# Patient Record
Sex: Female | Born: 1990 | Hispanic: No | Smoking: Never smoker
Health system: Southern US, Community
[De-identification: ages and names within clinical notes are randomized; demographics above are authoritative.]

## PROBLEM LIST (undated history)

## (undated) DIAGNOSIS — G935 Compression of brain: Secondary | ICD-10-CM

## (undated) DIAGNOSIS — F32A Depression, unspecified: Secondary | ICD-10-CM

## (undated) DIAGNOSIS — F329 Major depressive disorder, single episode, unspecified: Secondary | ICD-10-CM

## (undated) DIAGNOSIS — F419 Anxiety disorder, unspecified: Secondary | ICD-10-CM

---

## 2013-11-06 ENCOUNTER — Ambulatory Visit (INDEPENDENT_AMBULATORY_CARE_PROVIDER_SITE_OTHER): Payer: BC Managed Care – PPO | Admitting: Family Medicine

## 2013-11-06 VITALS — BP 102/74 | HR 72 | Temp 98.1°F | Resp 18 | Ht 65.5 in | Wt 180.0 lb

## 2013-11-06 DIAGNOSIS — J029 Acute pharyngitis, unspecified: Secondary | ICD-10-CM

## 2013-11-06 LAB — POCT RAPID STREP A (OFFICE): Rapid Strep A Screen: NEGATIVE

## 2013-11-06 MED ORDER — AMOXICILLIN 875 MG PO TABS: 875.0000 mg | ORAL_TABLET | Freq: Two times a day (BID) | ORAL | Status: DC

## 2013-11-06 NOTE — Patient Instructions (Signed)

## 2013-11-06 NOTE — Progress Notes (Signed)
Acute pharyngitis, unspecified pharyngitis type - Plan: amoxicillin (AMOXIL) 875 MG tablet, POCT rapid strep A, CANCELED: POCT rapid strep A  Signed, Elvina Sidle, MD      Patient ID: Olivia Kim MRN: [id number], DOB: 05/12/1990, 23 y.o. Date of Encounter: 11/06/2013, 1:49 PM  Primary Physician: No PCP Per Patient  Chief Complaint:  Chief Complaint  Patient presents with  . Sore Throat  . Cough    HPI: 23 y.o. year old female presents with day history of sore throat. Subjective fever and chills. No cough, congestion, rhinorrhea, sinus pressure, otalgia, or headache. Normal hearing. No GI complaints. Able to swallow saliva, but hurts to do so. Decreased appetite secondary to sore throat.   No past medical history on file.   Home Meds: Prior to Admission medications   Medication Sig Start Date End Date Taking? Authorizing Provider  amoxicillin (AMOXIL) 875 MG tablet Take 1 tablet (875 mg total) by mouth 2 (two) times daily. 11/06/13   Elvina Sidle, MD    Allergies: No Known Allergies  History   Social History  . Marital Status: Married    Spouse Name: N/A    Number of Children: N/A  . Years of Education: N/A   Occupational History  . Not on file.   Social History Main Topics  . Smoking status: Never Smoker   . Smokeless tobacco: Not on file  . Alcohol Use: Yes  . Drug Use: No  . Sexual Activity: Not on file   Other Topics Concern  . Not on file   Social History Narrative  . No narrative on file     Review of Systems: Constitutional: negative for chills, fever, night sweats or weight changes HEENT: see above Cardiovascular: negative for chest pain or palpitations Respiratory: negative for hemoptysis, wheezing, or shortness of breath Abdominal: negative for abdominal pain, nausea, vomiting or diarrhea Dermatological: negative for rash Neurologic: negative for headache   Physical Exam: Blood pressure 102/74, pulse 72, temperature 98.1 F (36.7  C), temperature source Oral, resp. rate 18, height 5' 5.5" (1.664 m), weight 180 lb (81.647 kg), last menstrual period 10/30/2013, SpO2 98.00%., Body mass index is 29.49 kg/(m^2). General: Well developed, well nourished, in no acute distress. Head: Normocephalic, atraumatic, eyes without discharge, sclera non-icteric, nares are patent. Bilateral auditory canals clear, TM's are without perforation, pearly grey with reflective cone of light bilaterally. No sinus TTP. Oral cavity moist, dentition normal. Posterior pharynx with post nasal drip and mild erythema. No peritonsillar abscess or tonsillar exudate. Neck: Supple. No thyromegaly. Full ROM. No lymphadenopathy. Lungs: Clear bilaterally to auscultation without wheezes, rales, or rhonchi. Breathing is unlabored. Heart: RRR with S1 S2. No murmurs, rubs, or gallops appreciated. Abdomen: Soft, non-tender, non-distended with normoactive bowel sounds. No hepatomegaly. No rebound/guarding. No obvious abdominal masses. Msk:  Strength and tone normal for age. Extremities: No clubbing or cyanosis. No edema. Neuro: Alert and oriented X 3. Moves all extremities spontaneously. CNII-XII grossly in tact. Psych:  Responds to questions appropriately with a normal affect.   Results for orders placed in visit on 11/06/13  POCT RAPID STREP A (OFFICE)      Result Value Ref Range   Rapid Strep A Screen Negative  Negative   (patient's grandmother insisted on doing the strep test)   ASSESSMENT AND PLAN:  23 y.o. year old female with acute pharyngitis -Acute pharyngitis, unspecified pharyngitis type - Plan: amoxicillin (AMOXIL) 875 MG tablet, POCT rapid strep A, CANCELED: POCT rapid strep A   -Tylenol/Motrin prn -  Rest/fluids -RTC precautions -RTC 3-5 days if no improvement  Signed, Elvina Sidle, MD 11/06/2013 1:49 PM

## 2013-11-08 ENCOUNTER — Telehealth: Payer: Self-pay

## 2013-11-08 NOTE — Telephone Encounter (Signed)
Will make note for pt today. Called and told her it would be ready for pick up.  Informed pt if symptoms are changing and not improving within next couple days that she should RTC for a re-check.

## 2013-11-08 NOTE — Telephone Encounter (Signed)
Patient seen recently by Dr. Milus Glazier. She received a note for school but now that she is vomiting, she needs another note for today as well. Can she get a new note ASAP? Cb# [phone]

## 2014-03-05 ENCOUNTER — Emergency Department (HOSPITAL_COMMUNITY)
Admission: EM | Admit: 2014-03-05 | Discharge: 2014-03-06 | Disposition: A | Discharge status: Home / Self Care | Payer: BLUE CROSS/BLUE SHIELD | Attending: Emergency Medicine | Admitting: Emergency Medicine

## 2014-03-05 ENCOUNTER — Encounter (HOSPITAL_COMMUNITY): Payer: Self-pay | Admitting: Emergency Medicine

## 2014-03-05 DIAGNOSIS — Z792 Long term (current) use of antibiotics: Secondary | ICD-10-CM | POA: Insufficient documentation

## 2014-03-05 DIAGNOSIS — Y9389 Activity, other specified: Secondary | ICD-10-CM | POA: Insufficient documentation

## 2014-03-05 DIAGNOSIS — Y9289 Other specified places as the place of occurrence of the external cause: Secondary | ICD-10-CM | POA: Insufficient documentation

## 2014-03-05 DIAGNOSIS — R4182 Altered mental status, unspecified: Secondary | ICD-10-CM | POA: Diagnosis present

## 2014-03-05 DIAGNOSIS — Z3202 Encounter for pregnancy test, result negative: Secondary | ICD-10-CM | POA: Diagnosis not present

## 2014-03-05 DIAGNOSIS — Y998 Other external cause status: Secondary | ICD-10-CM | POA: Insufficient documentation

## 2014-03-05 DIAGNOSIS — X58XXXA Exposure to other specified factors, initial encounter: Secondary | ICD-10-CM | POA: Diagnosis not present

## 2014-03-05 DIAGNOSIS — T483X2A Poisoning by antitussives, intentional self-harm, initial encounter: Secondary | ICD-10-CM | POA: Diagnosis not present

## 2014-03-05 LAB — CBC WITH DIFFERENTIAL/PLATELET
BASOS PCT: 0 % (ref 0–1)
Basophils Absolute: 0 10*3/uL (ref 0.0–0.1)
EOS PCT: 0 % (ref 0–5)
Eosinophils Absolute: 0 10*3/uL (ref 0.0–0.7)
HCT: 39.5 % (ref 36.0–46.0)
Hemoglobin: 13.6 g/dL (ref 12.0–15.0)
Lymphocytes Relative: 12 % (ref 12–46)
Lymphs Abs: 1.4 10*3/uL (ref 0.7–4.0)
MCH: 30.6 pg (ref 26.0–34.0)
MCHC: 34.4 g/dL (ref 30.0–36.0)
MCV: 89 fL (ref 78.0–100.0)
MONO ABS: 0.8 10*3/uL (ref 0.1–1.0)
Monocytes Relative: 7 % (ref 3–12)
NEUTROS PCT: 81 % — AB (ref 43–77)
Neutro Abs: 9.9 10*3/uL — ABNORMAL HIGH (ref 1.7–7.7)
PLATELETS: 287 10*3/uL (ref 150–400)
RBC: 4.44 MIL/uL (ref 3.87–5.11)
RDW: 12.1 % (ref 11.5–15.5)
WBC: 12.2 10*3/uL — ABNORMAL HIGH (ref 4.0–10.5)

## 2014-03-05 LAB — COMPREHENSIVE METABOLIC PANEL
ALT: 22 U/L (ref 0–35)
AST: 27 U/L (ref 0–37)
Albumin: 4.1 g/dL (ref 3.5–5.2)
Alkaline Phosphatase: 58 U/L (ref 39–117)
Anion gap: 8 (ref 5–15)
BUN: 6 mg/dL (ref 6–23)
CALCIUM: 9.3 mg/dL (ref 8.4–10.5)
CO2: 25 mmol/L (ref 19–32)
Chloride: 105 mEq/L (ref 96–112)
Creatinine, Ser: 1.03 mg/dL (ref 0.50–1.10)
GFR calc Af Amer: 89 mL/min — ABNORMAL LOW (ref >90)
GFR calc non Af Amer: 77 mL/min — ABNORMAL LOW (ref >90)
GLUCOSE: 111 mg/dL — AB (ref 70–99)
POTASSIUM: 3.3 mmol/L — AB (ref 3.5–5.1)
SODIUM: 138 mmol/L (ref 135–145)
Total Bilirubin: 0.6 mg/dL (ref 0.3–1.2)
Total Protein: 7.3 g/dL (ref 6.0–8.3)

## 2014-03-05 LAB — RAPID URINE DRUG SCREEN, HOSP PERFORMED
Amphetamines: NOT DETECTED
BARBITURATES: NOT DETECTED
BENZODIAZEPINES: NOT DETECTED
COCAINE: NOT DETECTED
OPIATES: NOT DETECTED
TETRAHYDROCANNABINOL: NOT DETECTED

## 2014-03-05 LAB — URINALYSIS, ROUTINE W REFLEX MICROSCOPIC
Bilirubin Urine: NEGATIVE
Glucose, UA: NEGATIVE mg/dL
Ketones, ur: NEGATIVE mg/dL
Nitrite: NEGATIVE
Protein, ur: NEGATIVE mg/dL
Specific Gravity, Urine: 1.006 (ref 1.005–1.030)
UROBILINOGEN UA: 0.2 mg/dL (ref 0.0–1.0)
pH: 6 (ref 5.0–8.0)

## 2014-03-05 LAB — ACETAMINOPHEN LEVEL: Acetaminophen (Tylenol), Serum: <10 ug/mL — ABNORMAL LOW (ref 10–30)

## 2014-03-05 LAB — URINE MICROSCOPIC-ADD ON

## 2014-03-05 LAB — SALICYLATE LEVEL: Salicylate Lvl: <4 mg/dL (ref 2.8–20.0)

## 2014-03-05 LAB — ETHANOL

## 2014-03-05 LAB — PREGNANCY, URINE: Preg Test, Ur: NEGATIVE

## 2014-03-05 MED ORDER — LORAZEPAM 2 MG/ML IJ SOLN
1.0000 mg | Freq: Once | INTRAMUSCULAR | Status: AC
  Administered 2014-03-05: 1 mg via INTRAVENOUS
  Filled 2014-03-05: qty 1

## 2014-03-05 MED ORDER — SODIUM CHLORIDE 0.9 % IV SOLN
INTRAVENOUS | Status: DC
  Administered 2014-03-05: 22:00:00 via INTRAVENOUS

## 2014-03-05 NOTE — ED Notes (Addendum)
Pt. presents with agitation, panic attack , altered mental status , and confusion , pt. stated she ingested a whole bottle of cough syrup this evening , respirations unlabored / denies suicidal ideation .

## 2014-03-05 NOTE — ED Provider Notes (Signed)
CSN: [id number]     Arrival date & time 03/05/14  2138 History   First MD Initiated Contact with Patient 03/05/14 2150     Chief Complaint  Patient presents with  . Altered Mental Status  . Agitation     (Consider location/radiation/quality/duration/timing/severity/associated sxs/prior Treatment) Patient is a 24 y.o. female presenting with altered mental status. The history is provided by the patient and the spouse.  Altered Mental Status   Olivia Kim is a 24 y.o. female who is here for evaluation of altered mental status, after drinking "a whole bottle of dextromethorphan."  She states that she was "trying to get skinny for [place]".  She admits that she may been tried to kill herself.  She is unable to give additional history.  Level V Caveat- Altered Mental Status   History reviewed. No pertinent past medical history. History reviewed. No pertinent past surgical history. No family history on file. History  Substance Use Topics  . Smoking status: Never Smoker   . Smokeless tobacco: Not on file  . Alcohol Use: Yes   OB History    No data available     Review of Systems  Unable to perform ROS     Allergies  Review of patient's allergies indicates no known allergies.  Home Medications   Prior to Admission medications   Medication Sig Start Date End Date Taking? Authorizing Provider  cyclobenzaprine (FLEXERIL) 5 MG tablet Take 5 mg by mouth 3 (three) times daily as needed for muscle spasms.   Yes Historical Provider, MD  medroxyPROGESTERone (DEPO-PROVERA) 150 MG/ML injection Inject 150 mg into the muscle every 3 (three) months.   Yes Historical Provider, MD  amoxicillin (AMOXIL) 875 MG tablet Take 1 tablet (875 mg total) by mouth 2 (two) times daily. 11/06/13   Elvina Sidle, MD  nitrofurantoin, macrocrystal-monohydrate, (MACROBID) 100 MG capsule Take 100 mg by mouth 2 (two) times daily. 02/22/14   Historical Provider, MD   BP 138/81 mmHg  Pulse 129  Temp(Src)  98.8 F (37.1 C) (Oral)  Resp 20  SpO2 99% Physical Exam  Constitutional: She is oriented to person, place, and time. She appears well-developed and well-nourished. She appears distressed (she is agitated and tearful.).  HENT:  Head: Normocephalic and atraumatic.  Right Ear: External ear normal.  Left Ear: External ear normal.  Eyes: Conjunctivae and EOM are normal. Pupils are equal, round, and reactive to light.  Neck: Normal range of motion and phonation normal. Neck supple.  Cardiovascular: Normal rate, regular rhythm and normal heart sounds.   Pulmonary/Chest: Effort normal and breath sounds normal. She exhibits no bony tenderness.  Abdominal: Soft. There is no tenderness.  Musculoskeletal: Normal range of motion.  Neurological: She is alert and oriented to person, place, and time. No cranial nerve deficit or sensory deficit. She exhibits normal muscle tone. Coordination normal.  She intermittently answers questions, without dysarthria or aphasia.  She is a poor historian.  No gross ataxia.  No nystagmus. She does not follow commands for complete neurologic testing.  Skin: Skin is warm, dry and intact.  Psychiatric:  Labile affect, frequently stops talking, and bursts into tears.  She has brief periods of lucidity, and periods of paranoid  thoughts and behavior.  Nursing note and vitals reviewed.   ED Course  Procedures (including critical care time)  The patient told the pharmacy technician, that she ingested a product called Delsym.  It is not clear if it had any ingredients in addition to, dextromethorphan.  Medications  0.9 %  sodium chloride infusion ( Intravenous New Bag/Given 03/05/14 2209)  LORazepam (ATIVAN) injection 1 mg (1 mg Intravenous Given 03/05/14 2210)    Patient Vitals for the past 24 hrs:  BP Temp Temp src Pulse Resp SpO2  03/05/14 2144 138/81 mmHg 98.8 F (37.1 C) Oral (!) 129 20 99 %    11:54 PM Reevaluation with update and discussion. After initial  assessment and treatment, an updated evaluation reveals she is, now, somewhat sleepy, and agrees to stay for further evaluation.  She complains of not being able to urinate.  She was catheterized, about 15 minutes ago, but cannot recall that.  Her significant other, Olivia Kim, is here with her. Olivia Kim L    Labs Review Labs Reviewed  URINALYSIS, ROUTINE W REFLEX MICROSCOPIC - Abnormal; Notable for the following:    Hgb urine dipstick TRACE (*)    Leukocytes, UA SMALL (*)    All other components within normal limits  COMPREHENSIVE METABOLIC PANEL - Abnormal; Notable for the following:    Potassium 3.3 (*)    Glucose, Bld 111 (*)    GFR calc non Af Amer 77 (*)    GFR calc Af Amer 89 (*)    All other components within normal limits  CBC WITH DIFFERENTIAL - Abnormal; Notable for the following:    WBC 12.2 (*)    Neutrophils Relative % 81 (*)    Neutro Abs 9.9 (*)    All other components within normal limits  ACETAMINOPHEN LEVEL - Abnormal; Notable for the following:    Acetaminophen (Tylenol), Serum <10.0 (*)    All other components within normal limits  URINE MICROSCOPIC-ADD ON - Abnormal; Notable for the following:    Squamous Epithelial / LPF FEW (*)    Bacteria, UA FEW (*)    All other components within normal limits  URINE CULTURE  PREGNANCY, URINE  URINE RAPID DRUG SCREEN (HOSP PERFORMED)  SALICYLATE LEVEL  ETHANOL    Imaging Review No results found.   EKG Interpretation   Date/Time:  Tuesday March 05 2014 22:27:05 EST Ventricular Rate:  100 PR Interval:  154 QRS Duration: 76 QT Interval:  336 QTC Calculation: 433 R Axis:   86 Text Interpretation:  Sinus tachycardia Consider right atrial enlargement  Borderline repolarization abnormality No old tracing to compare Confirmed  by [facility]  MD, Lillar Bianca [phone]([id number]) on 03/05/2014 10:38:34 PM      MDM   Final diagnoses:  Dextromethorphan overdose, intentional self-harm, initial encounter     Apparent,  dextromethorphan overdose.  Moderately severe toxicity.  I doubt severe anticholinergic toxicity.  Nursing Notes Reviewed/ Care Coordinated, and agree without changes. Applicable Imaging Reviewed.  Interpretation of Laboratory Data incorporated into ED treatment  Plan: Assessment by TTS, and disposition, in assistance with Emergency provider services  Flint MelterElliott L Paxon Propes, MD 03/08/14 [phone]

## 2014-03-05 NOTE — ED Notes (Signed)
Per [facility]eborah at [facility]:  If product ingested contains cloracedin watch out for: pinpoint pupils, urinary retention, seizures, paranoia.  Recommends EKG, Electrolytes, UA, CBC, and a Tylenol level.  Also recommends treatment with IV fluids and benzos for agitation (does not recommend Haldol).  Sts this medication is called "Triple C" in abusers of the product.

## 2014-03-06 MED ORDER — ALUM & MAG HYDROXIDE-SIMETH 200-200-20 MG/5ML PO SUSP: 30.0000 mL | ORAL | Status: DC | PRN

## 2014-03-06 MED ORDER — ONDANSETRON HCL 4 MG PO TABS
4.0000 mg | ORAL_TABLET | Freq: Three times a day (TID) | ORAL | Status: DC | PRN
Start: 2014-03-06 — End: 2014-03-06

## 2014-03-06 MED ORDER — ACETAMINOPHEN 325 MG PO TABS: 650.0000 mg | ORAL_TABLET | ORAL | Status: DC | PRN

## 2014-03-06 MED ORDER — IBUPROFEN 200 MG PO TABS: 600.0000 mg | ORAL_TABLET | Freq: Three times a day (TID) | ORAL | Status: DC | PRN

## 2014-03-06 MED ORDER — ZOLPIDEM TARTRATE 5 MG PO TABS: 5.0000 mg | ORAL_TABLET | Freq: Every evening | ORAL | Status: DC | PRN

## 2014-03-06 MED ORDER — NICOTINE 21 MG/24HR TD PT24: 21.0000 mg | MEDICATED_PATCH | Freq: Every day | TRANSDERMAL | Status: DC | PRN

## 2014-03-06 NOTE — Discharge Instructions (Signed)
Depression °Depression refers to feeling sad, low, down in the dumps, blue, gloomy, or empty. In general, there are two kinds of depression: °· Normal sadness or normal grief. This kind of depression is one that we all feel from time to time after upsetting life experiences, such as the loss of a job or the ending of a relationship. This kind of depression is considered normal, is short lived, and resolves within a few days to 2 weeks. Depression experienced after the loss of a loved one (bereavement) often lasts longer than 2 weeks but normally gets better with time. °· Clinical depression. This kind of depression lasts longer than normal sadness or normal grief or interferes with your ability to function at home, at work, and in school. It also interferes with your personal relationships. It affects almost every aspect of your life. Clinical depression is an illness. °Symptoms of depression can also be caused by conditions other than those mentioned above, such as: °· Physical illness. Some physical illnesses, including underactive thyroid gland (hypothyroidism), severe anemia, specific types of cancer, diabetes, uncontrolled seizures, heart and lung problems, strokes, and chronic pain are commonly associated with symptoms of depression. °· Side effects of some prescription medicine. In some people, certain types of medicine can cause symptoms of depression. °· Substance abuse. Abuse of alcohol and illicit drugs can cause symptoms of depression. °SYMPTOMS °Symptoms of normal sadness and normal grief include the following: °· Feeling sad or crying for short periods of time. °· Not caring about anything (apathy). °· Difficulty sleeping or sleeping too much. °· No longer able to enjoy the things you used to enjoy. °· Desire to be by oneself all the time (social isolation). °· Lack of energy or motivation. °· Difficulty concentrating or remembering. °· Change in appetite or weight. °· Restlessness or  agitation. °Symptoms of clinical depression include the same symptoms of normal sadness or normal grief and also the following symptoms: °· Feeling sad or crying all the time. °· Feelings of guilt or worthlessness. °· Feelings of hopelessness or helplessness. °· Thoughts of suicide or the desire to harm yourself (suicidal ideation). °· Loss of touch with reality (psychotic symptoms). Seeing or hearing things that are not real (hallucinations) or having false beliefs about your life or the people around you (delusions and paranoia). °DIAGNOSIS  °The diagnosis of clinical depression is usually based on how bad the symptoms are and how long they have lasted. Your health care provider will also ask you questions about your medical history and substance use to find out if physical illness, use of prescription medicine, or substance abuse is causing your depression. Your health care provider may also order blood tests. °TREATMENT  °Often, normal sadness and normal grief do not require treatment. However, sometimes antidepressant medicine is given for bereavement to ease the depressive symptoms until they resolve. °The treatment for clinical depression depends on how bad the symptoms are but often includes antidepressant medicine, counseling with a mental health professional, or both. Your health care provider will help to determine what treatment is best for you. °Depression caused by physical illness usually goes away with appropriate medical treatment of the illness. If prescription medicine is causing depression, talk with your health care provider about stopping the medicine, decreasing the dose, or changing to another medicine. °Depression caused by the abuse of alcohol or illicit drugs goes away when you stop using these substances. Some adults need professional help in order to stop drinking or using drugs. °SEEK IMMEDIATE MEDICAL   CARE IF:  You have thoughts about hurting yourself or others.  You lose touch  with reality (have psychotic symptoms).  You are taking medicine for depression and have a serious side effect. FOR MORE INFORMATION  National Alliance on Mental Illness: www.nami.[employer] of Mental Health: http://www.maynard.net/ Document Released: 01/30/2000 Document Revised: 06/18/2013 Document Reviewed: 05/03/2011 [facility] Patient Information 2015 [place], [place]. This information is not intended to replace advice given to you by your health care provider. Make sure you discuss any questions you have with your health care provider.   Poisoning Information Poisoning is illness caused by eating, drinking, touching, or inhaling a harmful substance. The damaging effects on the person's health will vary depending on the type of poison, the amount of exposure, and the duration of exposure before treatment. These effects may range from mild to very severe or even fatal.  Most poisonings take place in the home and involve common household products. They can also occur in the workplace, especially in industrial or manufacturing facilities. Poisoning is more common in children than adults. However, poisoning often causes more serious illness in adults. Poisonings are often accidental, but there are also many cases in which a person intentionally ingests poison. WHAT THINGS MAY BE POISONOUS?  A poison can be any substance that causes illness or harm to the body. Poisoning is often caused by products that are commonly found in homes. Many substances can become poisonous if used in ways or amounts that are not appropriate. Some common products that can cause poisoning are:   Medicines, including prescription medicines, over-the-counter pain medicines, vitamins, iron pills, and herbal supplements.  Cleaning or laundry products.  Paint and paint thinner.  Weed or insect killers.  Perfume, hair spray, or nail products.  Alcohol.  Plants, such as philodendron, poinsettia, oleander, castor  bean, cactus, and tomato plants.  Batteries.  Furniture polish.  Drain cleaners.  Antifreeze or other automotive products.  Gasoline, lighter fluid, or lamp oil.  Carbon monoxide gas from furnaces or automobiles.  Toxic fumes from the burning of plastics or certain other materials. WHAT ARE SOME FIRST-AID MEASURES FOR POISONING? The local poison control center must be contacted whenever a person may have been exposed to poison. The poison control specialist will often give a set of directions to follow over the phone. These directions may include the following:  Remove any substance that is still in the mouth if the poison was not food or medicine. Drink a small amount of water.  Keep the medicine container if too much medicine or the wrong medicine was swallowed. Use it to identify the medicine to the poison control specialist.  Get away from the area where exposure occurred as soon as possible if the poison was from fumes or chemicals.  Get fresh air as soon as possible if a poison was inhaled.  Remove any affected clothing and rinse the skin with water if a poison got on the skin.  Rinse the eyes with water if a poison or chemical got in the eyes.  Begin cardiopulmonary resuscitation (CPR) if breathing stops. HOW CAN YOU PREVENT POISONING? Take these steps to help prevent poisoning:  Keep medicines and chemical products in their original containers. Many of these come in child-safe packaging. Store them in areas out of reach of children.  Educate othersabout the dangers of possible poisons.  Read labels before using medicine or household products. Leave the original labels on the containers.  Always turn on a light when taking  medicine. Check the dosage every time.   Close the containers tightly after using medicine or chemical products.  Get rid of unneeded and outdated medicines by following the specific disposal instructions on the medicine label or the patient  information that came with the medicine. Do not put medicine in the trash or flush it down the toilet. Use the community's drug take-back program to dispose of medicine. If these options are not available, take the medicine out of the original container and mix it with an undesirable substance, such as coffee grounds or kitty litter. Seal the mixture in a sealable bag, can, or other container and throw it away.  Keep all dangerous household products (such as lighter fluid, paint thinner and remover, gasoline, and antifreeze) in locked cabinets.  Do not mix different household chemicals with each other.  Use protective equipment (gloves, goggles, masks, aprons) as needed when using chemicals or cleaners.  Install a carbon monoxide detector in your home. WHEN SHOULD YOU SEEK HELP?  Contact the poison control center wheneveryou suspect that a person has been exposed to poison. Call [phone] (in the [place] to reach a poison center for your area. If you are outside the [place] ask your health care provider what the phone number is for your local poison control center. Keep the phone number posted near your phone. Make sure everyone in your household knows where to find the number. The local emergency services (911 in [place] must be contacted if a person has been exposed to poison and:   Has trouble breathing or stops breathing.  Develops chest pain.  Has trouble staying awake or becomes unconscious.  Has a seizure.  Has severe vomiting or bleeding.  Has a worsening headache.  Has a decreased level of alertness.  Develops a widespread rash that may or may not be painful.  Has changes in vision.  Has difficulty swallowing.  Develops severe abdominal pain. FOR MORE INFORMATION  American Association of Poison Control Centers: www.aapcc.org Document Released: 01/19/2012 Document Revised: 06/18/2013 Document Reviewed: 01/19/2012 [facility] Patient Information 2015 [place]. This  information is not intended to replace advice given to you by your health care provider. Make sure you discuss any questions you have with your health care provider.

## 2014-03-06 NOTE — ED Notes (Signed)
Poison control called to check on pt status, confirmed that pt was "released" from poison control monitoring.

## 2014-03-06 NOTE — ED Notes (Signed)
[facility] will be fax[facility] a Engineer, manufacturing systemssafety contract (no harm), eating disorder consult (initial)-put sticker and put in medical record.

## 2014-03-06 NOTE — ED Notes (Signed)
Pt signed no harm contract, voicing that she does not want to do harm to herself or others. Pt also given resources for eating disorder consult and signed that she agrees to make an appointment this week.  Copy of no harm contract put in medical records.

## 2014-03-06 NOTE — ED Notes (Signed)
Bladder scan performed, pt had 72 mL in bladder.

## 2014-03-06 NOTE — ED Notes (Signed)
Pt made aware to return if symptoms worsen or if any life threatening symptoms occur.   
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
## 2014-03-07 ENCOUNTER — Encounter (HOSPITAL_COMMUNITY): Payer: Self-pay | Admitting: Neurology

## 2014-03-07 ENCOUNTER — Encounter (HOSPITAL_COMMUNITY): Payer: Self-pay | Admitting: *Deleted

## 2014-03-07 ENCOUNTER — Emergency Department (HOSPITAL_COMMUNITY)
Admission: EM | Admit: 2014-03-07 | Discharge: 2014-03-07 | Disposition: A | Discharge status: Other Acute Inpatient Hospital | Payer: BLUE CROSS/BLUE SHIELD | Attending: Emergency Medicine | Admitting: Emergency Medicine

## 2014-03-07 ENCOUNTER — Inpatient Hospital Stay (HOSPITAL_COMMUNITY)
Admission: EM | Admit: 2014-03-07 | Discharge: 2014-03-11 | DRG: 897 | Disposition: A | Discharge status: Home / Self Care | Payer: BLUE CROSS/BLUE SHIELD | Source: Intra-hospital | Attending: Emergency Medicine | Admitting: Emergency Medicine

## 2014-03-07 DIAGNOSIS — R410 Disorientation, unspecified: Secondary | ICD-10-CM

## 2014-03-07 DIAGNOSIS — F19980 Other psychoactive substance use, unspecified with psychoactive substance-induced anxiety disorder: Secondary | ICD-10-CM | POA: Diagnosis present

## 2014-03-07 DIAGNOSIS — Z818 Family history of other mental and behavioral disorders: Secondary | ICD-10-CM | POA: Diagnosis not present

## 2014-03-07 DIAGNOSIS — Z79899 Other long term (current) drug therapy: Secondary | ICD-10-CM | POA: Insufficient documentation

## 2014-03-07 DIAGNOSIS — F29 Unspecified psychosis not due to a substance or known physiological condition: Secondary | ICD-10-CM

## 2014-03-07 DIAGNOSIS — R45851 Suicidal ideations: Secondary | ICD-10-CM | POA: Diagnosis present

## 2014-03-07 DIAGNOSIS — Z792 Long term (current) use of antibiotics: Secondary | ICD-10-CM | POA: Diagnosis not present

## 2014-03-07 DIAGNOSIS — F19939 Other psychoactive substance use, unspecified with withdrawal, unspecified: Secondary | ICD-10-CM | POA: Diagnosis present

## 2014-03-07 DIAGNOSIS — F3110 Bipolar disorder, current episode manic without psychotic features, unspecified: Secondary | ICD-10-CM | POA: Clinically undetermined

## 2014-03-07 DIAGNOSIS — F1994 Other psychoactive substance use, unspecified with psychoactive substance-induced mood disorder: Secondary | ICD-10-CM | POA: Diagnosis present

## 2014-03-07 DIAGNOSIS — F1990 Other psychoactive substance use, unspecified, uncomplicated: Secondary | ICD-10-CM | POA: Diagnosis present

## 2014-03-07 DIAGNOSIS — R4182 Altered mental status, unspecified: Secondary | ICD-10-CM

## 2014-03-07 DIAGNOSIS — F19280 Other psychoactive substance dependence with psychoactive substance-induced anxiety disorder: Secondary | ICD-10-CM

## 2014-03-07 DIAGNOSIS — F19239 Other psychoactive substance dependence with withdrawal, unspecified: Secondary | ICD-10-CM | POA: Diagnosis present

## 2014-03-07 HISTORY — DX: Major depressive disorder, single episode, unspecified: F32.9

## 2014-03-07 HISTORY — DX: Depression, unspecified: F32.A

## 2014-03-07 HISTORY — DX: Anxiety disorder, unspecified: F41.9

## 2014-03-07 LAB — SALICYLATE LEVEL: Salicylate Lvl: <4 mg/dL (ref 2.8–20.0)

## 2014-03-07 LAB — CBC
HEMATOCRIT: 40 % (ref 36.0–46.0)
HEMOGLOBIN: 13.6 g/dL (ref 12.0–15.0)
MCH: 30.7 pg (ref 26.0–34.0)
MCHC: 34 g/dL (ref 30.0–36.0)
MCV: 90.3 fL (ref 78.0–100.0)
Platelets: 271 10*3/uL (ref 150–400)
RBC: 4.43 MIL/uL (ref 3.87–5.11)
RDW: 12.4 % (ref 11.5–15.5)
WBC: 8.7 10*3/uL (ref 4.0–10.5)

## 2014-03-07 LAB — COMPREHENSIVE METABOLIC PANEL
ALT: 23 U/L (ref 0–35)
AST: 26 U/L (ref 0–37)
Albumin: 4.1 g/dL (ref 3.5–5.2)
Alkaline Phosphatase: 55 U/L (ref 39–117)
Anion gap: 13 (ref 5–15)
BILIRUBIN TOTAL: 0.7 mg/dL (ref 0.3–1.2)
BUN: 10 mg/dL (ref 6–23)
CHLORIDE: 106 meq/L (ref 96–112)
CO2: 19 mmol/L (ref 19–32)
CREATININE: 0.89 mg/dL (ref 0.50–1.10)
Calcium: 8.9 mg/dL (ref 8.4–10.5)
GFR calc Af Amer: >90 mL/min (ref >90)
Glucose, Bld: 105 mg/dL — ABNORMAL HIGH (ref 70–99)
Potassium: 3.6 mmol/L (ref 3.5–5.1)
Sodium: 138 mmol/L (ref 135–145)
TOTAL PROTEIN: 7.2 g/dL (ref 6.0–8.3)

## 2014-03-07 LAB — URINE CULTURE
CULTURE: NO GROWTH
Colony Count: NO GROWTH
SPECIAL REQUESTS: NORMAL

## 2014-03-07 LAB — RAPID URINE DRUG SCREEN, HOSP PERFORMED
Amphetamines: NOT DETECTED
BARBITURATES: NOT DETECTED
Benzodiazepines: POSITIVE — AB
COCAINE: NOT DETECTED
OPIATES: NOT DETECTED
TETRAHYDROCANNABINOL: NOT DETECTED

## 2014-03-07 LAB — PROTIME-INR
INR: 1.19 (ref 0.00–1.49)
PROTHROMBIN TIME: 15.2 s (ref 11.6–15.2)

## 2014-03-07 LAB — ACETAMINOPHEN LEVEL: Acetaminophen (Tylenol), Serum: <10 ug/mL — ABNORMAL LOW (ref 10–30)

## 2014-03-07 LAB — ETHANOL: Alcohol, Ethyl (B): <5 mg/dL (ref 0–9)

## 2014-03-07 MED ORDER — ALUM & MAG HYDROXIDE-SIMETH 200-200-20 MG/5ML PO SUSP: 30.0000 mL | ORAL | Status: DC | PRN

## 2014-03-07 MED ORDER — MAGNESIUM HYDROXIDE 400 MG/5ML PO SUSP
30.0000 mL | Freq: Every day | ORAL | Status: DC | PRN
  Administered 2014-03-11: 30 mL via ORAL
  Filled 2014-03-07: qty 30

## 2014-03-07 MED ORDER — ACETAMINOPHEN 325 MG PO TABS
650.0000 mg | ORAL_TABLET | Freq: Four times a day (QID) | ORAL | Status: DC | PRN
  Administered 2014-03-08 – 2014-03-11 (×5): 650 mg via ORAL
  Filled 2014-03-07 (×4): qty 2

## 2014-03-07 MED ORDER — LORAZEPAM 1 MG PO TABS
1.0000 mg | ORAL_TABLET | ORAL | Status: AC | PRN
  Administered 2014-03-08: 1 mg via ORAL
  Filled 2014-03-07: qty 1

## 2014-03-07 MED ORDER — RISPERIDONE 2 MG PO TBDP
2.0000 mg | ORAL_TABLET | Freq: Three times a day (TID) | ORAL | Status: DC | PRN
  Administered 2014-03-07 – 2014-03-08 (×2): 2 mg via ORAL
  Filled 2014-03-07 (×3): qty 1

## 2014-03-07 MED ORDER — LORAZEPAM 1 MG PO TABS
1.0000 mg | ORAL_TABLET | Freq: Once | ORAL | Status: DC
  Filled 2014-03-07: qty 1

## 2014-03-07 MED ORDER — ONDANSETRON HCL 4 MG PO TABS: 4.0000 mg | ORAL_TABLET | Freq: Three times a day (TID) | ORAL | Status: DC | PRN

## 2014-03-07 MED ORDER — IBUPROFEN 400 MG PO TABS: 400.0000 mg | ORAL_TABLET | Freq: Three times a day (TID) | ORAL | Status: DC | PRN

## 2014-03-07 MED ORDER — TRAZODONE HCL 100 MG PO TABS
100.0000 mg | ORAL_TABLET | Freq: Every day | ORAL | Status: DC
  Administered 2014-03-07: 100 mg via ORAL
  Filled 2014-03-07 (×3): qty 1

## 2014-03-07 MED ORDER — NICOTINE 21 MG/24HR TD PT24: 21.0000 mg | MEDICATED_PATCH | Freq: Every day | TRANSDERMAL | Status: DC | PRN

## 2014-03-07 MED ORDER — ZIPRASIDONE MESYLATE 20 MG IM SOLR: 20.0000 mg | INTRAMUSCULAR | Status: DC | PRN

## 2014-03-07 MED ORDER — LORAZEPAM 1 MG PO TABS: 1.0000 mg | ORAL_TABLET | Freq: Three times a day (TID) | ORAL | Status: DC | PRN

## 2014-03-07 MED ORDER — ACETAMINOPHEN 325 MG PO TABS: 650.0000 mg | ORAL_TABLET | ORAL | Status: DC | PRN

## 2014-03-07 MED ORDER — LORAZEPAM 1 MG PO TABS
1.0000 mg | ORAL_TABLET | Freq: Once | ORAL | Status: AC
  Administered 2014-03-07: 1 mg via ORAL

## 2014-03-07 NOTE — ED Notes (Signed)
Pt family would like to implement password for restricting visitors. Per pt grandmother/gaurdian, password is: [id number]

## 2014-03-07 NOTE — ED Notes (Signed)
[facility]. Belongings went with pt's family.

## 2014-03-07 NOTE — ED Provider Notes (Signed)
Patient accepted by: Behavioral health for inpatient therapy. Patient will be transferred there. Paperwork completed.  Vanetta MuldersScott Tommye Lehenbauer, MD 03/07/14 1911
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
## 2014-03-07 NOTE — ED Notes (Signed)
Telepsych in the room and talking with the patient.

## 2014-03-07 NOTE — BH Assessment (Addendum)
Tele Assessment Note   Olivia Kim is an 24 y.o. female that was seen via tele assessment that was ordered by EDP Clarene Duke at [facility].  Called and set up tele assessment with this clinician and gathered clinical information from [facility] @ 1258.  Pt brought in to [facility] after threatening suicide with a knife that had to be taken away by her husband last night per her husband and grandmother that were present for assessment per the pt.  Pt was oriented to person only, had rambling, tangential, and incoherent speech.  Per husband and mother this began this morning.  They brought pt to [facility] on 1/19 and pt discharged because she was able to contract for safety.  Husband and grandmother have been taking care of the pt at home.  The pt reported she has been drinking cough syrup for one year and husband recently found out.  He knows that she drinks wine every other day.  Grandmother found 3 bottles unopened and took them from pt's possession this AM.  Per spouse, pt stated she has been drinking it to lose weight.  Pt has also been hanging around a friend that spouse believes has introduced her to drugs and even prostitution.  He stated pt stated she is going to work and he found out she has not been going.  Per pt's husband and grandmother, this is not like the pt at all.  Pt has not reported hallucinations, but asked where her spouse was when he was right in front of her.  She also called her grandmother and husband last week crying, stating she was "happy because I found myself" and was asking to go to church (she doesn't attend church.).  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  Pt has not mentioned wanting to hurt others and has no violent hx per family.  Pt did try to cut her arm superficially in college and saw a counselor for this.  Husband reported pt stays in the bathroom a great deal and has not been eating much.  Pt is not on any psychotropic medications and  was prescribed an antibiotic for a UTI, but pt's husband is unsure if she is taking it.  Pt's grandmother was her guardian.  Pt's biological mother has a dx of Schizophrenia and Bipolar Disorder per her grandmother.  Pt was only oriented to person, drowsy, and appeared disheveled.  Information from assessment gathered from family.  Inpatient treatment recommended for stabilization of sx and for pt's safety.  Consulted with Malachy Chamber, NP at [facility], who agreed inpatient treatment warranted and pt accepted at [facility] once medically cleared @ 1335.  Updated EDP Clarene Duke, who was in agreement with pt disposition.  Updated TTS staff.  Axis I: 296.34 Major Depressive Disorder, Recurrent Episode, Severe with Psychoisi Axis II: Deferred Axis III: History reviewed. No pertinent past medical history. Axis IV: other psychosocial or environmental problems Axis V: 21-30 behavior considerably influenced by delusions or hallucinations OR serious impairment in judgment, communication OR inability to function in almost all areas  Past Medical History: History reviewed. No pertinent past medical history.  History reviewed. No pertinent past surgical history.  Family History: No family history on file.  Social History:  reports that she has never smoked. She does not have any smokeless tobacco history on file. She reports that she drinks alcohol. She reports that she uses illicit drugs.  Additional Social History:  Alcohol /  Drug Use Pain Medications: None Prescriptions: An antibiotic for an UTI.  It started a week ago.  Also has flexoril for her left shoulder Over the Counter: Cough syrup History of alcohol / drug use?: Yes Longest period of sobriety (when/how long): unknown Negative Consequences of Use: Personal relationships Withdrawal Symptoms:  (headache) Substance #1 Name of Substance 1: Wine 1 - Age of First Use: 24 years of age 17 - Amount (size/oz): A couple glasses at a time 1 - Frequency: every other  day 1 - Duration: Year or so. 1 - Last Use / Amount: 01/17  CIWA: CIWA-Ar BP: 139/72 mmHg Pulse Rate: 110 COWS:    PATIENT STRENGTHS: (choose at least two) Average or above average intelligence General fund of knowledge Supportive family/friends Work skills  Allergies: No Known Allergies  Home Medications:  (Not in a hospital admission)  OB/GYN Status:  No LMP recorded. Patient has had an injection.  General Assessment Data [facility] Is this a Tele or Face-to-Face Assessment?: Tele Assessment Is this an Initial Assessment or a Re-assessment for this encounter?: Initial Assessment Living Arrangements: Spouse/significant other Can pt return to current living arrangement?: Yes Admission Status: Voluntary Is patient capable of signing voluntary admission?: Yes Transfer from: Acute Hospital Referral Source: Self/Family/Friend     [facility] Living Arrangements: Spouse/significant other Name of Psychiatrist: None Name of Therapist: None  Education Status Is patient currently in school?: No Highest grade of school patient has completed: Engineer, 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 Graduate Name of school: [employer]  Risk to self with the past 6 months Suicidal Ideation: Yes-Currently Present Suicidal Intent: Yes-Currently Present Is patient at risk for suicide?: Yes Suicidal Plan?: Yes-Currently Present Specify Current Suicidal Plan: Pt tried to get a knife to harm self, husband had to take it  Access to Means: Yes Specify Access to Suicidal Means: sharps What has been your use of drugs/alcohol within the last 12 months?: pt has been using cough syrup, unknown if using other substances Previous Attempts/Gestures: Yes How many times?: 1 (superficially cut wrist in college) Other Self Harm Risks: UTA Triggers for Past Attempts: Other personal contacts (Breakup with boyfriend (Current husband)) Intentional Self Injurious Behavior: Damaging Comment - Self Injurious Behavior:  drinking cough syrup, throwing up after meals Family Suicide History: No Recent stressful life event(s): Recent negative physical changes, Other (Comment) (SI with attempt, SA) Persecutory voices/beliefs?: No Depression: Yes Depression Symptoms: Despondent, Insomnia Substance abuse history and/or treatment for substance abuse?: Yes Suicide prevention information given to non-admitted patients: Not applicable  Risk to Others within the past 6 months Homicidal Ideation: No Thoughts of Harm to Others: No Current Homicidal Intent: No Current Homicidal Plan: No Access to Homicidal Means: No Identified Victim: na - pt denies History of harm to others?: No Assessment of Violence: None Noted Violent Behavior Description: na - pt cooperative, drowsy Does patient have access to weapons?: No Criminal Charges Pending?: No Describe Pending Criminal Charges: na  Does patient have a court date: No  Psychosis Hallucinations: None noted Delusions: None noted  Mental Status Report Appear/Hygiene: Disheveled Eye Contact: Poor Motor Activity: Psychomotor retardation Speech: Rapid, Incoherent Level of Consciousness: Drowsy Mood: Depressed Affect: Depressed Anxiety Level: None Panic attack frequency: UTA Most recent panic attack: UTA Thought Processes: Irrelevant, Flight of Ideas Judgement: Impaired Orientation: Person Obsessive Compulsive Thoughts/Behaviors: Unable to Assess  Cognitive Functioning Concentration: Unable to Assess Memory: Unable to Assess IQ: Average Insight: Unable to Assess Impulse Control: Unable to Assess Appetite: Poor Weight Loss:  (  Husband reports pt is not eating) Weight Gain: 0 Sleep: Decreased Total Hours of Sleep:  (husband reports pt is not sleeping) Vegetative Symptoms: None  ADLScreening [facility] Assessment Services) Patient's cognitive ability adequate to safely complete daily activities?: Yes Patient able to express need for assistance with ADLs?:  Yes Independently performs ADLs?: Yes (appropriate for developmental age)  Prior Inpatient Therapy Prior Inpatient Therapy: No Prior Therapy Dates: None Prior Therapy Facilty/Provider(s): None Reason for Treatment: None  Prior Outpatient Therapy Prior Outpatient Therapy: Yes Prior Therapy Dates: Two years ago Prior Therapy Facilty/Provider(s): Therapist at [facility] Reason for Treatment: SI  ADL Screening (condition at time of admission) Patient's cognitive ability adequate to safely complete daily activities?: Yes Is the patient deaf or have difficulty hearing?: No Does the patient have difficulty seeing, even when wearing glasses/contacts?: No Does the patient have difficulty concentrating, remembering, or making decisions?: No Patient able to express need for assistance with ADLs?: Yes Does the patient have difficulty dressing or bathing?: No Independently performs ADLs?: Yes (appropriate for developmental age) Does the patient have difficulty walking or climbing stairs?: No  Home Assistive Devices/Equipment Home Assistive Devices/Equipment: None    Abuse/Neglect Assessment (Assessment to be complete while patient is alone) Physical Abuse: Denies Verbal Abuse: Denies Sexual Abuse: Denies Exploitation of patient/patient's resources: Denies Self-Neglect: Denies Values / Beliefs Cultural Requests During Hospitalization: None Spiritual Requests During Hospitalization: None Consults Spiritual Care Consult Needed: No Social Work Consult Needed: No Merchant navy officer (For Healthcare) Does patient have an advance directive?: No Would patient like information on creating an advanced directive?: No - patient declined information    Additional Information 1:1 In Past 12 Months?: No CIRT Risk: No Elopement Risk: No Does patient have medical clearance?: No     Disposition:  Disposition Initial Assessment Completed for this Encounter: Yes Disposition of Patient:  Referred to, Inpatient treatment program Type of inpatient treatment program: Adult Patient referred to:  (Pt accepted [facility] once medically cleared)  Casimer Lanius, MS, [facility] Licensed Professional Counselor Therapeutic Triage Specialist [facility] Phone: [phone] Fax: [phone]  03/07/2014 1:52 PM

## 2014-03-07 NOTE — ED Notes (Signed)
Olivia Kim in room to do in and out cath with Carolyn(registration) as chaperone

## 2014-03-07 NOTE — ED Notes (Signed)
Staffing and House coverage called

## 2014-03-07 NOTE — ED Notes (Signed)
Pt called out stating her head hurts and she needs help. This RN stated she could have tylenol or ibuprofen, pt stated she did not want those medications. Pt appears very anxious, Tatyana, PA at bedside to evaluate pt complaint.

## 2014-03-07 NOTE — Tx Team (Signed)
Initial Interdisciplinary Treatment Plan   PATIENT STRESSORS: Marital or family conflict Substance abuse Pt obsessed with weight loss   PATIENT STRENGTHS: Average or above average intelligence General fund of knowledge Special hobby/interest Supportive family/friends   PROBLEM LIST: Problem List/Patient Goals Date to be addressed Date deferred Reason deferred Estimated date of resolution  Risk for self harm      Anxiety      Agitation      Tangential/disorganized thoughts      "I need morphine and adderall"      "I'm fat; I need to lose weight"                         DISCHARGE CRITERIA:  Ability to meet basic life and health needs Improved stabilization in mood, thinking, and/or behavior Motivation to continue treatment in a less acute level of care Need for constant or close observation no longer present Verbal commitment to aftercare and medication compliance  PRELIMINARY DISCHARGE PLAN: Attend aftercare/continuing care group Attend PHP/IOP Outpatient therapy Return to previous living arrangement  PATIENT/FAMIILY INVOLVEMENT: This treatment plan has been presented to and reviewed with the patient, Despina PoleClaretha Forsman, and/or family member.  The patient and family have been given the opportunity to ask questions and make suggestions.  Jesus GeneraSpeagle, Tifany Hirsch [facility] 03/07/2014, 11:10 PM

## 2014-03-07 NOTE — Progress Notes (Signed)
Vol admit to the 300 hall, 24 yo caucasian female, with altered mental status and recent attempt to stab herself with a knife per report from her husband.  Pt has been abusing cough syrup in an attempt to "lose weight" and reports she has been doing this for a year.  [facility], pt's thought processes were disorganized and tangential.  She was preoccupied with requests for medication, rapidly asking for morphine, adderall, tylenol, and "thyroid".  When writer asked her to clarify what she meant by thyroid, she said, "you know, for weight loss".  Pt then proceeded to recite the ABCs and started counting.  Pt's speech is very rapid and pressured.  Pt denies SI/HI/AV.  Much of pt's admission information was taken from info provided by her family at the ED.  Pt was provided a meal from the cafeteria, but she took the sandwich apart and said she could not eat it because the Mal[place]key was too salty.  She only ate a couple of the chips and chose to throw the rest of the meal away.  Pt was cooperative with the search and after explanation of the paperwork, pt signed each of the forms.  Pt did not have any belongings to lock up as her family had taken her possessions home from the ED.  Pt was briefly oriented to the unit and orders were received from the NP on call.  Safety checks q15 minutes initiated.

## 2014-03-07 NOTE — ED Notes (Signed)
Pt noted to have illogical speech, rapid speech. Family reports she was acting fine this morning, then started "talking out of her head". "I want adderall in pill form". Family unsure is drugs ingested. Denies Si or HI

## 2014-03-07 NOTE — BH Assessment (Signed)
[facility]    Per Berneice Heinrichina Tate, [facility]1 to Dr. Jettie BoozeEeapen.  Updated pt's nurse, GrenadaBrittany, and she is to update EDP Zackowski and compelte coluntary paperwork with the pt.  [facility] [place], as pt is voluntary.  Updated ED and TTS staff.  Casimer LaniusKristen Iokepa Geffre, MS, [facility] Licensed Professional Counselor Therapeutic Triage Specialist [facility] Phone: [phone] Fax: [phone]

## 2014-03-07 NOTE — ED Notes (Signed)
Lemont Fillersatyana, PA reported to this RN that pt was requesting several pain medications by name including morphine, oxycodone, and vicodin. Tatyana, PA explained to pt that narcotics will not be given to her at this time and she may have tylenol or ibuprofen if she would like medication for pain.

## 2014-03-07 NOTE — ED Notes (Signed)
Sitter [facility] [place] [facility].

## 2014-03-08 ENCOUNTER — Encounter (HOSPITAL_COMMUNITY): Payer: Self-pay | Admitting: Psychiatry

## 2014-03-08 DIAGNOSIS — F1994 Other psychoactive substance use, unspecified with psychoactive substance-induced mood disorder: Secondary | ICD-10-CM | POA: Diagnosis present

## 2014-03-08 DIAGNOSIS — F1990 Other psychoactive substance use, unspecified, uncomplicated: Secondary | ICD-10-CM | POA: Diagnosis present

## 2014-03-08 DIAGNOSIS — F329 Major depressive disorder, single episode, unspecified: Secondary | ICD-10-CM

## 2014-03-08 MED ORDER — SERTRALINE HCL 25 MG PO TABS
25.0000 mg | ORAL_TABLET | Freq: Every day | ORAL | Status: DC
  Administered 2014-03-08 – 2014-03-11 (×4): 25 mg via ORAL
  Filled 2014-03-08 (×8): qty 1

## 2014-03-08 MED ORDER — OLANZAPINE 5 MG PO TBDP
5.0000 mg | ORAL_TABLET | Freq: Three times a day (TID) | ORAL | Status: DC | PRN
  Administered 2014-03-08 – 2014-03-10 (×4): 5 mg via ORAL
  Filled 2014-03-08 (×5): qty 1

## 2014-03-08 MED ORDER — LORAZEPAM 1 MG PO TABS
1.0000 mg | ORAL_TABLET | ORAL | Status: DC | PRN
  Administered 2014-03-08 – 2014-03-10 (×6): 1 mg via ORAL
  Filled 2014-03-08 (×6): qty 1

## 2014-03-08 MED ORDER — BENZTROPINE MESYLATE 0.5 MG PO TABS
0.5000 mg | ORAL_TABLET | Freq: Two times a day (BID) | ORAL | Status: DC
  Administered 2014-03-08 – 2014-03-09 (×2): 0.5 mg via ORAL
  Filled 2014-03-08 (×6): qty 1

## 2014-03-08 MED ORDER — TRAZODONE HCL 50 MG PO TABS
50.0000 mg | ORAL_TABLET | Freq: Every evening | ORAL | Status: DC | PRN
  Administered 2014-03-08 – 2014-03-10 (×3): 50 mg via ORAL
  Filled 2014-03-08 (×3): qty 1

## 2014-03-08 MED ORDER — HALOPERIDOL 2 MG PO TABS
2.0000 mg | ORAL_TABLET | Freq: Two times a day (BID) | ORAL | Status: DC
  Administered 2014-03-08 – 2014-03-09 (×2): 2 mg via ORAL
  Filled 2014-03-08 (×6): qty 1

## 2014-03-08 NOTE — Progress Notes (Addendum)
Pt remains asleep with regular respirations. She does not appear in any distress. Pt did not go to breakfast this am. Pts husband called that he found a large bag of caffeine pills , sudafed pills in her backpack and  generic adderall. Husband is very concerned and would like to speak to MD. At 8:15am Pt was  lying on the floor outside the nurses station and stated,"I am hungry." Pt had been given a breakfast tray earlier which she ate  and was given cereal upon her request. MD made aware of the phone call the writer received from pts husband. Pt does not appear delusional this am and does c/o feeling lethargic.Pt stated,"I need more caffeine and sleep meds.I also need medicine for thryoid problems."Pt rates her depression a 3/10-Pt is attending groups and c/o feeling very tired. Pt was compliant taking her am medication.12:30pm-Pt was brought back from the cafeteria because she was lying on the floor . Pt started saying," I am 32% body fat ,body fat ,body fat." Pt was given 2mg  of respiradol ODT and 1mg  of ativan po for extreme agitiation,.1:30pm-Called pts husband as pt kept repeatedly saying,My head , my head I hit my head." Husband stated that the patient hit her head at work three weeks ago but was checked out. He stated on Tuesday," she was so strong it took all I had to hold her down so she would not get hurt." She was banging her head on the fireplace and the coffee table. " Pt does have a ecymotic area on her forehead. NP, May, made aware and will evaluate the pt. Per husband the pt was seen in the ER times two. He stated,"her strength was something like I have never seen before and I am 220pounds and play footballl." 1:40pm_pt c/o left occitipal pain. She is alert to person and place but unable to tell the nurse the name of the president other than to say O and the year she stated,"it is 26. " Pt is able to stick her tongue out but can not stay awake long enough to follow simple commands with the exception  of sticking out her tongue. NP, May made aware. 4pm _pt was seen by May. She is requesting medication for constipation and was given 30cc of MOM. Pt has a difficult time processing. She will see her husband this pm at 6:30pm and she is excited. 6:30pm-Husband is very upset that his wife is not the same person.He stated ,"she is the most brilliant person you will meet. She graduated suma cum laude and wants to be a Administrator, Civil Servicevet. He is so concerned. Husband put out a 5950 B on a girl he feels is having a negative influence on his wife.

## 2014-03-08 NOTE — Tx Team (Signed)
  Interdisciplinary Treatment Plan Update   Date Reviewed:  03/08/2014  Time Reviewed:  10:55 AM  Progress in Treatment:   Attending groups: Yes Participating in groups: Yes Taking medication as prescribed: Yes  Tolerating medication: Yes Family/Significant other contact made: Yes  Patient understands diagnosis: No  Limited insight  Discussing patient identified problems/goals with staff: Yes  See initial care plan Medical problems stabilized or resolved: Yes Denies suicidal/homicidal ideation: Yes  In tx team Patient has not harmed self or others: Yes  For review of initial/current patient goals, please see plan of care.  Estimated Length of Stay:  4-5 days  Reason for Continuation of Hospitalization: Depression Medication stabilization Withdrawal symptoms  New Problems/Goals identified:  N/A  Discharge Plan or Barriers:   return home, follow up outpt  Additional Comments:  [facility] threatening suicide with a knife that had to be taken away by her husband last night per her husband and grandmother that were present for assessment per the pt. Pt was oriented to person only, had rambling, tangential, and incoherent speech. Per husband and mother this began this morning. [facility] and pt discharged because she was able to contract for safety. Husband and grandmother have been taking care of the pt at home. The pt reported she has been drinking cough syrup for one year and husband recently found out. He knows that she drinks wine every other day. Grandmother found 3 bottles unopened and took them from pt's possession this AM. Per spouse, pt stated she has been drinking it to lose weight. Pt has also been hanging around a friend that spouse believes has introduced her to drugs and even prostitution. He stated pt stated she is going to work and he found out she has not been going. Per pt's husband and grandmother, this is not like the pt at  all. Pt has not reported hallucinations, but asked where her spouse was when he was right in front of her. She also called her grandmother and husband last week crying, stating she was "happy because I found myself" and was asking to go to church (she doesn't attend church.). 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  Attendees:  Signature: Ivin BootySarama Eappen, MD 03/08/2014 10:55 AM   Signature: Richelle Itood Dalonte Hardage, LCSW 03/08/2014 10:55 AM  Signature:  03/08/2014 10:55 AM  Signature:  03/08/2014 10:55 AM  Signature: Rodman KeyJanet Webb, RN 03/08/2014 10:55 AM  Signature:  03/08/2014 10:55 AM  Signature:   03/08/2014 10:55 AM  Signature:    Signature:    Signature:    Signature:    Signature:    Signature:      Scribe for Treatment Team:   Richelle Itood Alexah Kivett, LCSW  03/08/2014 10:55 AM
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
## 2014-03-08 NOTE — [facility] Suicide Risk Assessment (Signed)
[facility]   Nursing information obtained from:  Review of record Demographic factors:  Adolescent or young adult, Caucasian, Unemployed Current Mental Status:  Self-harm behaviors Loss Factors:  NA Historical Factors:  Family history of mental illness or substance abuse Risk Reduction Factors:  Living with another person, especially a relative, Positive social support Total Time spent with patient: 30 minutes Principal Problem: <principal problem not specified> Diagnosis:   Patient Active Problem List   Diagnosis Date Noted  . 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 Psychosis [F29] 03/07/2014     Continued Clinical Symptoms:  Alcohol Use Disorder Identification Test Final Score (AUDIT): 2 The "Alcohol Use Disorders Identification Test", Guidelines for Use in Primary Care, Second Edition.  World Science writerHealth Organization [facility](WHO). Score between 0-7:  no or low risk or alcohol related problems. Score between 8-15:  moderate risk of alcohol related problems. Score between 16-19:  high risk of alcohol related problems. Score 20 or above:  warrants further diagnostic evaluation for alcohol dependence and treatment.   CLINICAL FACTORS:   Alcohol/Substance Abuse/Dependencies Currently Psychotic   Musculoskeletal: Strength & Muscle Tone: within normal limits Gait & Station: normal Patient leans: N/A  Psychiatric Specialty Exam: Physical Exam  ROS  Blood pressure 115/65, pulse 92, temperature 98.1 F (36.7 C), temperature source Oral, resp. rate 20, height 5\' 4"  (1.626 m), weight 79.379 kg (175 lb), SpO2 100 %.Body mass index is 30.02 kg/(m^2).   Please see H&P.   SUICIDE RISK:   Moderate:  Frequent suicidal ideation with limited intensity, and duration, some specificity in terms of plans, no associated intent, good self-control, limited dysphoria/symptomatology, some risk factors present, and identifiable protective factors, including  available and accessible social support.  PLAN OF CARE: Please see H&P.   Medical Decision Making:  Review of Psycho-Social Stressors (1), Review or order clinical lab tests (1), Decision to obtain old records (1), Established Problem, Worsening (2), New Problem, with no additional work-up planned (3), Review of Medication Regimen & Side Effects (2) and Review of New Medication or Change in Dosage (2)  I certify that inpatient services furnished can reasonably be expected to improve the patient's condition.   Obi Scrima MD 03/08/2014, 1:42 PM

## 2014-03-08 NOTE — [facility] Counselor (Signed)
Adult Comprehensive Assessment  Patient ID: Olivia Kim, female   DOB: 1990/09/17, 24 y.o.   MRN: [id number]  Information Source: Information source: Patient  Current Stressors:  Substance abuse: Pt has been abusing stimulants  Living/Environment/Situation:  Living Arrangements: Spouse/significant other Living conditions (as described by patient or guardian): good How long has patient lived in current situation?: Almost 3 years What is atmosphere in current home: Comfortable, Supportive, Loving  Family History:  Marital status: Married Number of Years Married: 3 Does patient have children?: No  Childhood History:  By whom was/is the patient raised?: Grandparents Additional childhood history information: lived with mother until 2nd grade, grandparents since then until married  Description of patient's relationship with caregiver when they were a child: good Patient's description of current relationship with people who raised him/her: grandfather died in 2004/07/15, good with grandmother Does patient have siblings?: Yes Number of Siblings: 5 Description of patient's current relationship with siblings: half siblings-never sees them Did patient suffer any verbal/emotional/physical/sexual abuse as a child?: No Did patient suffer from severe childhood neglect?: No Has patient ever been sexually abused/assaulted/raped as an adolescent or adult?: No Was the patient ever a victim of a crime or a disaster?: No Witnessed domestic violence?: No Has patient been effected by domestic violence as an adult?: No  Education:     Employment/Work Situation:   Employment situation: Employed Where is patient currently employed?: [employer] How long has patient been employed?: Couple of years Patient's job has been impacted by current illness: Yes Describe how patient's job has been impacted: not been going regularly What is the longest time patient has a held a job?: see above  Where was the  patient employed at that time?: see above Has patient ever been in the [employer]?: No Has patient ever served in combat?: No  Financial Resources:   Financial resources: Income from employment, Income from spouse Does patient have a Lawyer or guardian?: No  Alcohol/Substance Abuse:   What has been your use of drugs/alcohol within the last 12 months?: Pt has been abusing cough syrup and stimulants If attempted suicide, did drugs/alcohol play a role in this?: Yes Alcohol/Substance Abuse Treatment Hx: Denies past history Has alcohol/substance abuse ever caused legal problems?: No  Social Support System:   Conservation officer, nature Support System: Production assistant, radio System: family, friends  Leisure/Recreation:   Leisure and Hobbies: Reading  Strengths/Needs:   What things does the patient do well?: Good with animals In what areas does patient struggle / problems for patient: Unable to say  Discharge Plan:   Does patient have access to transportation?: Yes Will patient be returning to same living situation after discharge?: Yes Currently receiving community mental health services: No If no, would patient like referral for services when discharged?: Yes (What county?) Does patient have financial barriers related to discharge medications?: No  Summary/Recommendations:   Summary and Recommendations (to be completed by the evaluator): Olivia "Re" is a 24 YO Caucasian female with no previous mental health hospitalization nor intervention who is 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  Her husband reports that she has been abusing cough medicine, and he found stimulants in her backpack at home. 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  She can benefit from crises stabilization, medication managment, therapeutic milieu and referral for services.  We will assess her further to confirm our initial  diagnosis, and then encourage her to seek help for substance  abuse  if, in fact, that is the issue.  Olivia Kim, Olivia Ruesch B. 03/08/2014

## 2014-03-09 DIAGNOSIS — F29 Unspecified psychosis not due to a substance or known physiological condition: Secondary | ICD-10-CM

## 2014-03-09 LAB — URINALYSIS W MICROSCOPIC (NOT AT ARMC)
BILIRUBIN URINE: NEGATIVE
GLUCOSE, UA: NEGATIVE mg/dL
Hgb urine dipstick: NEGATIVE
Ketones, ur: NEGATIVE mg/dL
Leukocytes, UA: NEGATIVE
Nitrite: NEGATIVE
Protein, ur: NEGATIVE mg/dL
SPECIFIC GRAVITY, URINE: 1.001 — AB (ref 1.005–1.030)
Urobilinogen, UA: 0.2 mg/dL (ref 0.0–1.0)
pH: 6.5 (ref 5.0–8.0)

## 2014-03-09 MED ORDER — HALOPERIDOL 5 MG PO TABS
5.0000 mg | ORAL_TABLET | Freq: Two times a day (BID) | ORAL | Status: DC
  Administered 2014-03-09: 5 mg via ORAL
  Filled 2014-03-09 (×5): qty 1

## 2014-03-09 MED ORDER — BENZTROPINE MESYLATE 1 MG PO TABS
1.0000 mg | ORAL_TABLET | Freq: Two times a day (BID) | ORAL | Status: DC
  Administered 2014-03-09: 1 mg via ORAL
  Filled 2014-03-09 (×5): qty 1

## 2014-03-09 NOTE — Progress Notes (Addendum)
Late entry for 1715   D Pt demonstrating more and more difficulty maintaining control . She is restless, she is unable to stay still, pacing and walking up and down the hall. SHe is intrusive, she asks the same questions over and over and over . She demonstrates word salad, along with flight of ideas and she describes " jumbled  fast " racing thoughts.   A Her behavior has spiraled out of control as the day has progressed. She has been begging and asking to be dc'd home and this nurse  Explained to her  that MD said she would not be dc'd today and pt unable to hear this. Husband allowed to come in and eat with her and visit her . The history he shares is that pt was abusing  Cough medicine and adderrall as well as alcohol, for questionable amount of time ( possibly 4 weeks) prior to her admission as well as experinecing behavioral and sleep changes that pt was unwulling / unable to discuss with husband at the time they presented.   [facility].
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
## 2014-03-09 NOTE — [facility] Group Notes (Signed)
[facility] LCSW Group Therapy Note  03/09/2014 / 10:30 AM  Type of Therapy and Topic:  Group Therapy: Avoiding Self-Sabotaging and Enabling Behaviors  Participation Level:  Active  Therapeutic Goals: 1. Patient will identify one obstacle that relates to self-sabotage and enabling behaviors 2. Patient will identify one personal self-sabotaging or enabling behavior they did prior to admission 3. Patient able to establish a plan to change the above identified behavior they did prior to admission:  4. Patient will demonstrate ability to communicate their needs through discussion and/or role plays.   Summary of Patient Progress: The main focus of today's process group was to discuss what "self-sabotage" means and use Motivational Interviewing to discuss what benefits, negative or positive, were involved in a self-identified self-sabotaging behavior. We then talked about reasons the patient may want to change the behavior and current desire to change. The patient, who reports desire to be called Olivia Kim, shared frequently yet comments were lengthy, tangential and disorganized.  Patient needed frequent redirection in order to avoid monopolizing of group time.   Therapeutic Modalities:   Cognitive Behavioral Therapy Person-Centered Therapy Motivational Interviewing   Olivia Bernatherine C [name], LCSW

## 2014-03-09 NOTE — Progress Notes (Signed)
.  Psychoeducational Group Note    Date: 03/09/2014 Time: 1000    Goal Setting Purpose of Group: To be able to set a goal that is measurable and that can be accomplished in one day Participation Level:  Active  Participation Quality:  Pt interrupted several of the other Patients but was on target with her content  Affect:  Lethargic, rambling  Cognitive:  Disorganized  Insight:  Limited. Poor  Engagement in Group:  Limited  Additional Comments:  Pt attended the group, paid attention and did provide feedback for a goal. engaged in the group.  Dione HousekeeperJudge, Ronae Noell A

## 2014-03-09 NOTE — Progress Notes (Signed)
Pt has been agitated most of the evening.  Pt had a visit with her husband tonight, and he was heard to tell pt that she did not need any med tonight, only some tylenol for her headache pain.  After he left, she is still asking for various medications including morphine and adderall.  She has also been c/o diarrhea, but pt had received MOM this afternoon.  Pt has had multiple somatic complaints.  Vital signs were taken late this evening, and found to be WNL.  Pt c/o her arms swelling, but no sign of edema found.  Writer only found a redden area to her L forearm where pt had been scratching.  Between 2130 and 2330, but was in and out of her room with multiple somatic complaints.  Each time, complaint was addressed and pt was redirected to her room.  Pt continues to have tangential/disorganized  thought processing.  She denies SI/HI/AV at this time.  Support and encouragement offered.  Safety maintained with q15 minute checks.

## 2014-03-09 NOTE — Progress Notes (Signed)
At this time, pt is talking on the phone with her grandmother.  Pt still with rapid speech and delusional thinking.  She had told her grandmother that she was being discharged tomorrow, so grandmother wanted to speak with RN.  RN informed GM that pt was not discharging tomorrow, and that the MD would need to discuss D/C with pt and give order before pt could leave.  Writer gave pt a magazine about animal care for which she was very Adult nurseappreciative, as she works in a Therapist, sportsvet office and loves animals.  Pt continues on 1:1 for disorganized thoughts and impulsive behaviors.  Sitter with pt.  Pt denies SI/HI/AV.  Pt is safe at this time.

## 2014-03-10 ENCOUNTER — Inpatient Hospital Stay (HOSPITAL_COMMUNITY): Payer: BLUE CROSS/BLUE SHIELD

## 2014-03-10 ENCOUNTER — Encounter (HOSPITAL_COMMUNITY): Payer: Self-pay | Admitting: Nurse Practitioner

## 2014-03-10 DIAGNOSIS — F3110 Bipolar disorder, current episode manic without psychotic features, unspecified: Secondary | ICD-10-CM | POA: Clinically undetermined

## 2014-03-10 DIAGNOSIS — F29 Unspecified psychosis not due to a substance or known physiological condition: Secondary | ICD-10-CM | POA: Insufficient documentation

## 2014-03-10 LAB — LIPID PANEL
CHOLESTEROL: 139 mg/dL (ref 0–200)
HDL: 62 mg/dL (ref >39)
LDL Cholesterol: 68 mg/dL (ref 0–99)
TRIGLYCERIDES: 43 mg/dL (ref <150)
Total CHOL/HDL Ratio: 2.2 RATIO
VLDL: 9 mg/dL (ref 0–40)

## 2014-03-10 LAB — URINE CULTURE
Colony Count: NO GROWTH
Culture: NO GROWTH

## 2014-03-10 LAB — HEMOGLOBIN A1C
HEMOGLOBIN A1C: 5.2 % (ref <5.7)
MEAN PLASMA GLUCOSE: 103 mg/dL (ref <117)

## 2014-03-10 LAB — TSH: TSH: 2.782 u[IU]/mL (ref 0.350–4.500)

## 2014-03-10 MED ORDER — LORAZEPAM 2 MG/ML IJ SOLN
2.0000 mg | Freq: Once | INTRAMUSCULAR | Status: AC
  Administered 2014-03-10: 2 mg via INTRAMUSCULAR

## 2014-03-10 MED ORDER — OLANZAPINE 10 MG IM SOLR
10.0000 mg | Freq: Once | INTRAMUSCULAR | Status: DC
  Filled 2014-03-10: qty 10

## 2014-03-10 MED ORDER — OLANZAPINE 5 MG PO TBDP
5.0000 mg | ORAL_TABLET | Freq: Two times a day (BID) | ORAL | Status: DC
  Administered 2014-03-11 (×2): 5 mg via ORAL
  Filled 2014-03-10 (×7): qty 1

## 2014-03-10 MED ORDER — DIPHENHYDRAMINE HCL 50 MG/ML IJ SOLN
INTRAMUSCULAR | Status: AC
  Administered 2014-03-10: 16:00:00
  Filled 2014-03-10: qty 1

## 2014-03-10 MED ORDER — LORAZEPAM 2 MG/ML IJ SOLN
INTRAMUSCULAR | Status: AC
  Administered 2014-03-10: 16:00:00
  Filled 2014-03-10: qty 1

## 2014-03-10 MED ORDER — DIPHENHYDRAMINE HCL 50 MG/ML IJ SOLN
50.0000 mg | Freq: Once | INTRAMUSCULAR | Status: AC
  Administered 2014-03-10: 50 mg via INTRAMUSCULAR
  Filled 2014-03-10: qty 1

## 2014-03-10 MED ORDER — DIVALPROEX SODIUM ER 250 MG PO TB24
250.0000 mg | ORAL_TABLET | Freq: Two times a day (BID) | ORAL | Status: DC
  Administered 2014-03-10 – 2014-03-11 (×2): 250 mg via ORAL
  Filled 2014-03-10 (×6): qty 1

## 2014-03-10 MED ORDER — DIVALPROEX SODIUM ER 250 MG PO TB24
ORAL_TABLET | ORAL | Status: AC
  Filled 2014-03-10: qty 1

## 2014-03-10 MED ORDER — DIPHENHYDRAMINE HCL 50 MG/ML IJ SOLN
25.0000 mg | Freq: Once | INTRAMUSCULAR | Status: AC
  Administered 2014-03-10: 25 mg via INTRAMUSCULAR
  Filled 2014-03-10: qty 0.5
  Filled 2014-03-10: qty 1

## 2014-03-10 MED ORDER — OLANZAPINE 10 MG IM SOLR
INTRAMUSCULAR | Status: AC
  Administered 2014-03-10: 16:00:00
  Filled 2014-03-10: qty 10

## 2014-03-10 MED ORDER — LORAZEPAM 2 MG/ML IJ SOLN
2.0000 mg | Freq: Once | INTRAMUSCULAR | Status: AC
  Administered 2014-03-10: 2 mg via INTRAMUSCULAR
  Filled 2014-03-10: qty 1

## 2014-03-10 NOTE — ED Notes (Signed)
[facility] due to ? Head injury from hitting head against wall prior to coming to hospital. [facility] husband told them while pt was psychotic she banged her head against the wall multiple times. Due to this she has been sent here for evaluation.

## 2014-03-10 NOTE — ED Notes (Signed)
Per Dr Patria Maneampos, [facility]. Off duty officer is calling an Technical sales engineerofficer to transport pt d/t IVC and flight risk

## 2014-03-10 NOTE — ED Notes (Signed)
Pt screaming while [employer], charge nurse and MD at bedside. Unclear of what she is saying. Pt able to be redirected. [facility]

## 2014-03-10 NOTE — ED Notes (Signed)
Bed: WA06 Expected date: 03/10/14 Expected time: 5:12 PM Means of arrival: Ambulance Comments: [facility]

## 2014-03-10 NOTE — [facility] Group Notes (Signed)
[facility] Group Notes:  (Nursing/MHT/Case Management/Adjunct)  Date:  03/10/2014  Time:  1500pm  Type of Therapy:  Therapeutic Activity  Participation Level:  Active  Participation Quality:  Intrusive and Sharing  Affect:  Flat and Lethargic  Cognitive:  Disorganized  Insight:  Lacking  Engagement in Group:  Off Topic  Modes of Intervention:  Activity  Summary of Progress/Problems: Patient attended group and was intrusive during activity but did participate.  Lendell CapriceGuthrie, Naika Noto A 03/10/2014, 3:34 PM

## 2014-03-10 NOTE — ED Provider Notes (Signed)
CSN: [id number]     Arrival date & time 03/10/14  1730 History   First MD Initiated Contact with Patient 03/10/14 1732     No chief complaint on file.    (C HPI [facility].  They believe this to be a psychotic break.  Given her young age and no prior history of psychosis the patient was sent to the emergency department for CT scan.  Patient complains of no headache at this time.  She is calm and cooperative at this time.  She denies chest pain or abdominal pain.  She appears to be somewhat sedated by the recent medications given at the behavior health hospital.  She has no other complaints.   Past Medical History  Diagnosis Date  . Anxiety   . Depression    History reviewed. No pertinent past surgical history. Family History  Problem Relation Age of Onset  . Bipolar disorder Mother   . ADD / ADHD Mother    History  Substance Use Topics  . Smoking status: Never Smoker   . Smokeless tobacco: Not on file  . Alcohol Use: Yes     Comment: cough syrup; pt reports drinking wine once a month   OB History    No data available     Review of Systems  All other systems reviewed and are negative.     Allergies  Review of patient's allergies indicates no known allergies.  Home Medications   Prior to Admission medications   Medication Sig Start Date End Date Taking? Authorizing Provider  amoxicillin (AMOXIL) 875 MG tablet Take 1 tablet (875 mg total) by mouth 2 (two) times daily. Patient not taking: Reported on 03/07/2014 11/06/13   Elvina Sidle, MD  cyclobenzaprine (FLEXERIL) 5 MG tablet Take 5 mg by mouth 3 (three) times daily as needed for muscle spasms.    Historical Provider, MD  medroxyPROGESTERone (DEPO-PROVERA) 150 MG/ML injection Inject 150 mg into the muscle every 3 (three) months.    Historical Provider, MD  nitrofurantoin, macrocrystal-monohydrate, (MACROBID) 100 MG capsule Take 100 mg by mouth 2  (two) times daily. 02/22/14   Historical Provider, MD   BP 123/78 mmHg  Pulse 88  Temp(Src) 98.5 F (36.9 C) (Oral)  Resp 16  Ht  (1.626 m)  Wt 175 lb (79.379 kg)  BMI 30.02 kg/m2  SpO2 100% Physical Exam  Constitutional: She is oriented to person, place, and time. She appears well-developed and well-nourished. No distress.  HENT:  Head: Normocephalic and atraumatic.  Eyes: EOM are normal.  Neck: Normal range of motion.  Cardiovascular: Normal rate, regular rhythm and normal heart sounds.   Pulmonary/Chest: Effort normal and breath sounds normal.  Abdominal: Soft. She exhibits no distension. There is no tenderness.  Musculoskeletal: Normal range of motion.  Neurological: She is alert and oriented to person, place, and time.  Skin: Skin is warm and dry.  Psychiatric: She has a normal mood and affect. She is slowed and withdrawn. Cognition and memory are impaired. She expresses no suicidal plans and no homicidal plans.  Nursing note and vitals reviewed.   ED Course  Procedures (including critical care time) Labs Review   Imaging Review Ct Head Wo Contrast  03/10/2014   CLINICAL DATA:  Altered mental status and confusion. Was "banging head", with worsening agitation and psychotic behavior. Initial encounter.  EXAM: CT HEAD WITHOUT CONTRAST  TECHNIQUE: Contiguous axial images were obtained from the base of the  skull through the vertex without intravenous contrast.  COMPARISON:  None.  FINDINGS: There is no evidence of acute infarction, mass lesion, or intra- or extra-axial hemorrhage on CT. Low-lying cerebellar tonsils are suggested.  The posterior fossa, including the cerebellum, brainstem and fourth ventricle, is within normal limits. The third and lateral ventricles, and basal ganglia are unremarkable in appearance. The cerebral hemispheres are symmetric in appearance, with normal gray-white differentiation. No mass effect or midline shift is seen.  There is no evidence of  fracture; visualized osseous structures are unremarkable in appearance. The visualized portions of the orbits are within normal limits. The paranasal sinuses and mastoid air cells are well-aerated. No significant soft tissue abnormalities are seen.  IMPRESSION: 1. No acute intracranial pathology seen on CT. 2. Low-lying cerebellar tonsils suggested. This could be further assessed on MRI, on an elective non-emergent basis, to assess for Chiari I malformation.   Electronically Signed   By: Roanna RaiderJeffery  Chang M.D.   On: 03/10/2014 17:58  I personally reviewed the imaging tests through PACS system I reviewed available ER/hospitalization records through the EMR    EKG Interpretation None      MDM   Final diagnoses:  Confusion  Psychosis    Patient is medically clear at this time.  Nothing on her CT scan to suggest the cause of her acute psychosis.  I believe this NICU psychotic break.  Patient be transferred back to behavior [facility] at this time    Lyanne CoKevin M Jorgia Manthei, MD 03/10/14 [phone]

## 2014-03-10 NOTE — Progress Notes (Signed)
Patient ID: Olivia Kim, female   DOB: 1990-04-04, 24 y.o.   MRN: [id number] [facility] MD Progress Note  03/10/2014 9:39 AM Olivia Kim  MRN:  [id number] Subjective:  "I want to call my husband, I have a thyroid problem, Can I go home now?,  I have a 3.9 GPA at [employer]:  Patient observed pacing hallways, intrusive to others.  Remain on 1:1 care.  She was examined by Dr Lolly Mustache and NP.  Slurred, disorganized, at times rapid and incoherent speech.  She is re-directable at times.  She is hard to follow when asking questions.  She appears somnolent.   Principal Problem: Psychosis Diagnosis:   Patient Active Problem List   Diagnosis Date Noted  . R/O Bipolar I disorder, most recent episode (or current) manic [F31.10] 03/10/2014  . 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 Substance use disorder [F19.90] 03/08/2014  . Psychosis [F29] 03/07/2014   Total Time spent with patient: 45 minutes   Past Medical History:  Past Medical History  Diagnosis Date  . Anxiety   . Depression    History reviewed. No pertinent past surgical history. Family History:  Family History  Problem Relation Age of Onset  . Bipolar disorder Mother   . ADD / ADHD Mother    Social History:  History  Alcohol Use  . Yes    Comment: cough syrup; pt reports drinking wine once a month     History  Drug Use  . Yes  . Special: Other-see comments    Comment: cough syrup    History   Social History  . Marital Status: Married    Spouse Name: N/A    Number of Children: N/A  . Years of Education: N/A   Social History Main Topics  . Smoking status: Never Smoker   . Smokeless tobacco: None  . Alcohol Use: Yes     Comment: cough syrup; pt reports drinking wine once a month  . Drug Use: Yes    Special: Other-see comments     Comment: cough syrup  . Sexual Activity: Yes    Birth Control/ Protection: Injection   Other Topics Concern  . None   Social History Narrative   Additional  History:    Sleep: Poor  Appetite:  Poor  Assessment:  Olivia Kim is a 24 yo female patient presenting with Psychotic behavior (NOS).  She is disorganized both in thought  and in speech.  Musculoskeletal: Strength & Muscle Tone: within normal limits Gait & Station: normal, wearing an ortho boot Patient leans: N/A  Psychiatric Specialty Exam: Physical Exam  Vitals reviewed. Psychiatric: Her mood appears anxious. Her affect is labile and inappropriate. Her speech is tangential and slurred. Cognition and memory are impaired. She expresses impulsivity. She is inattentive.    ROS  Blood pressure 101/63, pulse 120, temperature 98.5 F (36.9 C), temperature source Oral, resp. rate 16, height  (1.626 m), weight 79.379 kg (175 lb), SpO2 100 %.Body mass index is 30.02 kg/(m^2).  General Appearance: Disheveled  Eye Contact::  Poor  Speech:  Blocked, Garbled, Pressured and Slurred  Volume:  Decreased  Mood:  Dysphoric and Irritable  Affect:  Non-Congruent, Constricted, Depressed, Inappropriate, Labile, Full Range and Tearful  Thought Process:  Disorganized, Irrelevant and Tangential  Orientation:  Full (Time, Place, and Person)  Thought Content:  Delusions, Ilusions, Obsessions and disorganized, difficult to follow  Suicidal Thoughts:  Denies  Homicidal Thoughts:  Denies  Memory:  Immediate;   Poor Recent;  Poor Remote;   Poor  Judgement:  Poor  Insight:  Lacking  Psychomotor Activity:  Normal  Concentration:  Poor  Recall:  Poor  Fund of Knowledge:Poor  Language: Poor  Akathisia:  Negative  Handed:  Right  AIMS (if indicated):     Assets:  Social Support  ADL's:  Impaired  Cognition: Impaired,  Moderate  Sleep:  Number of Hours: 3.75   Current Medications: Current Facility-Administered Medications  Medication Dose Route Frequency Provider Last Rate Last Dose  . acetaminophen (TYLENOL) tablet 650 mg  650 mg Oral Q6H PRN Verne Spurr, PA-C   650 mg at 03/09/14  1127  . alum & mag hydroxide-simeth (MAALOX/MYLANTA) 200-200-20 MG/5ML suspension 30 mL  30 mL Oral Q4H PRN Verne Spurr, PA-C      . divalproex (DEPAKOTE ER) 24 hr tablet 250 mg  250 mg Oral BID Velna Hatchet May Agustin, NP   250 mg at 03/10/14 0934  . divalproex (DEPAKOTE ER) 250 MG 24 hr tablet           . LORazepam (ATIVAN) tablet 1 mg  1 mg Oral Q4H PRN Jomarie Longs, MD   1 mg at 03/09/14 2222  . magnesium hydroxide (MILK OF MAGNESIA) suspension 30 mL  30 mL Oral Daily PRN Verne Spurr, PA-C      . OLANZapine zydis (ZYPREXA) disintegrating tablet 5 mg  5 mg Oral BID Sheila May Agustin, NP      . sertraline (ZOLOFT) tablet 25 mg  25 mg Oral Daily Jomarie Longs, MD   25 mg at 03/10/14 0934  . traZODone (DESYREL) tablet 50 mg  50 mg Oral QHS PRN Jomarie Longs, MD   50 mg at 03/09/14 2222    Lab Results:  Results for orders placed or performed during the hospital encounter of 03/07/14 (from the past 48 hour(s))  Lipid panel     Status: None   Collection Time: 03/10/14  6:31 AM  Result Value Ref Range   Cholesterol 139 0 - 200 mg/dL   Triglycerides 43 <161 mg/dL   HDL 62 >09 mg/dL   Total CHOL/HDL Ratio 2.2 RATIO   VLDL 9 0 - 40 mg/dL   LDL Cholesterol 68 0 - 99 mg/dL    Comment:        Total Cholesterol/HDL:CHD Risk Coronary Heart Disease Risk Table                     Men   Women  1/2 Average Risk   3.4   3.3  Average Risk       5.0   4.4  2 X Average Risk   9.6   7.1  3 X Average Risk  23.4   11.0        Use the calculated Patient Ratio above and the CHD Risk Table to determine the patient's CHD Risk.        ATP III CLASSIFICATION (LDL):  <100     mg/dL   Optimal  604-540  mg/dL   Near or Above                    Optimal  130-159  mg/dL   Borderline  981-191  mg/dL   High  >478     mg/dL   Very High Performed at [facility]     Physical Findings: AIMS: Facial and Oral Movements Muscles of Facial Expression: None, normal Lips and Perioral Area: None,  normal Jaw: None, normal  Tongue: None, normal,Extremity Movements Upper (arms, wrists, hands, fingers): None, normal Lower (legs, knees, ankles, toes): None, normal, Trunk Movements Neck, shoulders, hips: None, normal, Overall Severity Severity of abnormal movements (highest score from questions above): None, normal Incapacitation due to abnormal movements: None, normal Patient's awareness of abnormal movements (rate only patient's report): No Awareness, Dental Status Current problems with teeth and/or dentures?: No Does patient usually wear dentures?: No  CIWA:  CIWA-Ar Total: 7 COWS:     Treatment Plan Summary:  Review of chart, vital signs, medications, and notes.  1-Individual and group therapy  2-Medication management for psychosis:  Zyprexa changed to scheduled dose of 5 mg, Start Depakote 250 mg BID for mood lability     Ordered full metabolic panel 3-Coping skills for depression, anxiety  4-Continue crisis stabilization and management  5-Address health issues--monitoring vital signs, stable  6-Treatment plan in progress to prevent relapse of depression and anxiety  Medical Decision Making:  Review of Psycho-Social Stressors (1), Review and summation of old records (2), Review or order medicine tests (1) and Review of Medication Regimen & Side Effects (2) Problem Points:  Established problem, worsening (2) and Review of psycho-social stressors (1) Data Points:  Discuss tests with performing physician (1) Review and summation of old records (2) Review of medication regiment & side effects (2)  AGUSTIN, SHEILA MAY, AGNP-BC 03/10/2014, 9:39 AM   I have personally seen the patient and agreed with the findings and involved in the treatment plan. Kathryne SharperSyed Boyd [place], MD

## 2014-03-10 NOTE — Progress Notes (Signed)
Late entry 1:1 for 1300 D Olivia Kim cont to be labile, diff to manage on unit ( ie she slides to the floor and sits on the floor in the middle of the hall, she pushes her body through the locked door, pushing the staff 1:1 person and RN out of the way, she refuses to follow staff's direction ( ie " lets don' t sit on the floor, lets don't run down the hall, you need to lie  Down on the bed and allow the medication to help calm you down and be careful and not run". Her behavior remains diff to manage, even on a 1:1. She is  Still not sleeping , she closes her eyes for 30 minute period and then jumps up crying, " Is Olivia Kim coming to see me? Where is he? When can i  Go home? When can i get some adderrall? Can i have my throid medicine? A Pt's husband presented to the hospital, and this nurse and CN CJ met with him to hear his concerns.They are as follows: 1) he reports pt was hitting herself  uncontrollably in the head the night of her admission ( he says this is the main reason he took he to the ED, originally, 2) he requests MD order a cat scan, to rule out pathology and ./ or recent brain trauma ( which would explain her recent bizarre behaviors),  3) he shares that he has recently learned pt has been " abusing cough syrups, caffeine pills and adderrall " for over a year" and that he feels she should be medicated for this and lastly, he says: " I'm here because I think the best thing is for me to take her home and  take care of her myself. I love her more than anybody and I cannot stand to see her hurt like this. RN spoke with MD ( DR Adele Schilder) as well as CN and AC, outlining situation. Educated husband about psychiatric hospitalization, protocol for discharge and poc. Safety in place and poc cont

## 2014-03-10 NOTE — Progress Notes (Signed)
After sleeping for a short time from the IM medications, pt got back up at @0440  and was agitated again, getting loud and argumentative with the sitter.  Pt was difficult to redirect and sitters were switched out for the pt.  Pt had begun accusing the sitter of cursing her and calling her names.  The hall MHT reports that what pt is saying of the sitter is untrue.  Pt was given Zyprexa zydis 5 mg at this time.  Pt took the med and immediately was asking for more medications.  Writer spent time trying to remind pt of the substances she has been abusing and that her body is having withdrawals.  Explained that the medications she is being given are to help her to detox safely.  Pt is not being cooperative and is having difficulty processing what RN is telling her.  Staff will allow her to go to the dayroom early and watch TV if she agrees to stay quiet and not disturb the other patients.  Pt is agreeable.  Pt continues to be monitored 1:1 for safety.  Sitter with pt.  Pt remains safe.

## 2014-03-10 NOTE — Progress Notes (Signed)
Shortly after writer did the 1:1 check at 0100, MHT from the hall came to report that pt was up and becoming disruptive and uncontrollable.  When writer went to the room, pt was at the door demanding to call husband.  Pt was told that the phones were turned off per protocol and that she could call husband in the morning.  Pt was demanding to call him now to tell him she loved him.  Writer kept trying to explain that phones would be turned back on at 6am, but that writer would allow her to call at 5am if she would lie down and get some rest as she has little sleep tonight.  Pt was adamant that she needed to call husband.  Then pt said she would take a shot if we would let her call husband.  Again Clinical research associatewriter told pt that she would have to wait until at least 5 am, but that an order could be obtained for a shot if that would help her relax.  Writer called NP on call and received order for IV Ativan 2 mg with IV Benadryl 25 mg.  Writer and another Charity fundraiserN along with sitter were in the room with pt at the time the injection was given.  Pt took the inj willingly and even pulled her pants down for Clinical research associatewriter.  Pt then said to writer, "I am calm now; can I please leave and go home?"  Writer again explained that night staff could not discharge her without an order from her MD who would be here in the morning.  Pt continues to want to argue with staff and try to manipulate staff to do what she wants.  Writer spent some time after giving the inj talking with the patient about her dogs at home and her special interest.  Pt was becoming tired and sedated as RN talked with her.  RN encouraged pt to lie down and try to sleep the rest of the night and explained how important sleep is to our health and mental awareness.  Pt continues to be monitored 1:1 for safety.  Sitter at bedside.  Pt is safe at this time.

## 2014-03-10 NOTE — Progress Notes (Signed)
1712 Pt given ( at 1545 )  50 mg Benadryl IM, 2 mg Ativan IM and 10 mg Zyprexa IM for continued agitation,psychosis and inability to sleep. Pt cont to be agitated. Per NP, [facility]/u r/o head trauma . Pt's husband Gertie Baron( Justin Franklyn) called by this nurse.

## 2014-03-10 NOTE — Progress Notes (Addendum)
Patient continues to be intrusive, banging and kicking doors, disorganized and stuttering speech.  She would force self through doors and lay on the floor.  Spoke with Dr Lucianne MussKumar, discussed giving IM Benadryl/Ativan/Zyprexa.  Patient momentarily calm and now is bursting in tears.  Furthermore, spoke and discussed with [place] ConstableKen Campos M.D. ED Provider about patient's condition.  He will gladly evaluate and determine if further treatment is necessary. To note per collateral information given by patient's husband:  Patient was seen banging her head at home before she was brought in the ED.    I agreed with the findings, treatment and disposition plan of this patient. Kathryne SharperSyed Liridona Mashaw, MD

## 2014-03-10 NOTE — Progress Notes (Signed)
Pt resting in bed with eyes closed.  No distress observed.  Respirations even/unlabored.  Continue 1:1 for safety.  Sitter at bedside.  Pt safe at this time. 

## 2014-03-10 NOTE — [facility] Group Notes (Signed)
[facility] LCSW Group Therapy  03/10/2014 11:00 AM   Type of Therapy:  Group Therapy  Participation Level:  Did Not Attend  Merryn Thaker Horton, LCSW 03/10/2014 12:19 PM     

## 2014-03-10 NOTE — Progress Notes (Signed)
1:1  Late entry for 0900  Olivia Kim is seen out in the milieu...with 1:1 MHT at her side. SHe remains disorganized, intrusive, demonstrating diff maintianing control and following directions. She is sedated - looking. SHe has very dry lips ( ? Dehydrated ) and is given water pitcher and  Encouraged to drink water. She asks repeatedly " when can I go home? areyou going to give me some adderrall? When can i see the dr? When are you giving me  my thryoid medicine? Can i see my husband? [facility] [employer]??? A She is maintained on her 1:1. Haldol and cogentin are dc'Olivia and depakote therapy is started. Pt is educated about these med changes and states " tell me again"  R Safety is in place and this nurse and charge nurse speak with pt's husband outlining these changes.

## 2014-03-10 NOTE — Progress Notes (Signed)
[facility] MD Progress Note  03/10/2014 9:29 AM Olivia Kim  MRN:  [id number] Subjective:  "I want to call my husband, I have a thyroid problem, Can I go home now?" Obejctive:  Patient observed pacing hallways, intrusive to others.  Slurred, disorganized and incoherent speech.  She is re-directable at times.    Principal Problem: Psychosis Diagnosis:   Patient Active Problem List   Diagnosis Date Noted  . R/O Bipolar I disorder, most recent episode (or current) manic [F31.10] 03/10/2014  . 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 Substance use disorder [F19.90] 03/08/2014  . Psychosis [F29] 03/07/2014   Total Time spent with patient: 45 minutes   Past Medical History:  Past Medical History  Diagnosis Date  . Anxiety   . Depression    History reviewed. No pertinent past surgical history. Family History:  Family History  Problem Relation Age of Onset  . Bipolar disorder Mother   . ADD / ADHD Mother    Social History:  History  Alcohol Use  . Yes    Comment: cough syrup; pt reports drinking wine once a month     History  Drug Use  . Yes  . Special: Other-see comments    Comment: cough syrup    History   Social History  . Marital Status: Married    Spouse Name: N/A    Number of Children: N/A  . Years of Education: N/A   Social History Main Topics  . Smoking status: Never Smoker   . Smokeless tobacco: None  . Alcohol Use: Yes     Comment: cough syrup; pt reports drinking wine once a month  . Drug Use: Yes    Special: Other-see comments     Comment: cough syrup  . Sexual Activity: Yes    Birth Control/ Protection: Injection   Other Topics Concern  . None   Social History Narrative   Additional History:    Sleep: Poor  Appetite:  Poor   Assessment:   Musculoskeletal: Strength & Muscle Tone: within normal limits Gait & Station: normal, wearing an ortho boot Patient leans: N/A   Psychiatric Specialty Exam: Physical Exam  Vitals  reviewed. Psychiatric: Her mood appears anxious. Her affect is labile and inappropriate. Her speech is tangential and slurred. Cognition and memory are impaired. She expresses impulsivity. She is inattentive.    Review of Systems  HENT: Negative.   Eyes: Negative.   Respiratory: Negative.   Cardiovascular: Negative.   Gastrointestinal: Negative.   Genitourinary: Negative.   Musculoskeletal: Negative.   Skin: Negative.   Endo/Heme/Allergies: Negative.   Psychiatric/Behavioral: Positive for depression. Negative for suicidal ideas, hallucinations, memory loss and substance abuse. The patient is nervous/anxious. The patient does not have insomnia.     Blood pressure 101/63, pulse 120, temperature 98.5 F (36.9 C), temperature source Oral, resp. rate 16, height  (1.626 m), weight 79.379 kg (175 lb), SpO2 100 %.Body mass index is 30.02 kg/(m^2).  General Appearance: Disheveled  Eye Contact::  Poor  Speech:  Blocked, Garbled, Pressured and Slurred  Volume:  Decreased  Mood:  Dysphoric and Irritable  Affect:  Non-Congruent, Constricted, Depressed, Inappropriate, Labile, Full Range and Tearful  Thought Process:  Disorganized, Irrelevant and Tangential  Orientation:  Full (Time, Place, and Person)  Thought Content:  Delusions, Ilusions, Obsessions and disorganized, difficult to follow  Suicidal Thoughts:  Denies  Homicidal Thoughts:  Denies  Memory:  Immediate;   Poor Recent;   Poor Remote;   Poor  Judgement:  Poor  Insight:  Lacking  Psychomotor Activity:  Normal  Concentration:  Poor  Recall:  Poor  Fund of Knowledge:Poor  Language: Poor  Akathisia:  Negative  Handed:  Right  AIMS (if indicated):     Assets:  Social Support  ADL's:  Impaired  Cognition: Impaired,  Moderate  Sleep:  Number of Hours: 3.75     Current Medications: Current Facility-Administered Medications  Medication Dose Route Frequency Provider Last Rate Last Dose  . acetaminophen (TYLENOL) tablet 650  mg  650 mg Oral Q6H PRN Verne Spurr, PA-C   650 mg at 03/09/14 1127  . alum & mag hydroxide-simeth (MAALOX/MYLANTA) 200-200-20 MG/5ML suspension 30 mL  30 mL Oral Q4H PRN Verne Spurr, PA-C      . divalproex (DEPAKOTE ER) 24 hr tablet 250 mg  250 mg Oral BID Sheila May Agustin, NP      . LORazepam (ATIVAN) tablet 1 mg  1 mg Oral Q4H PRN Jomarie Longs, MD   1 mg at 03/09/14 2222  . magnesium hydroxide (MILK OF MAGNESIA) suspension 30 mL  30 mL Oral Daily PRN Verne Spurr, PA-C      . OLANZapine zydis (ZYPREXA) disintegrating tablet 5 mg  5 mg Oral BID Sheila May Agustin, NP      . sertraline (ZOLOFT) tablet 25 mg  25 mg Oral Daily Jomarie Longs, MD   25 mg at 03/09/14 0834  . traZODone (DESYREL) tablet 50 mg  50 mg Oral QHS PRN Jomarie Longs, MD   50 mg at 03/09/14 2222    Lab Results:  Results for orders placed or performed during the hospital encounter of 03/07/14 (from the past 48 hour(s))  Lipid panel     Status: None   Collection Time: 03/10/14  6:31 AM  Result Value Ref Range   Cholesterol 139 0 - 200 mg/dL   Triglycerides 43 <161 mg/dL   HDL 62 >09 mg/dL   Total CHOL/HDL Ratio 2.2 RATIO   VLDL 9 0 - 40 mg/dL   LDL Cholesterol 68 0 - 99 mg/dL    Comment:        Total Cholesterol/HDL:CHD Risk Coronary Heart Disease Risk Table                     Men   Women  1/2 Average Risk   3.4   3.3  Average Risk       5.0   4.4  2 X Average Risk   9.6   7.1  3 X Average Risk  23.4   11.0        Use the calculated Patient Ratio above and the CHD Risk Table to determine the patient's CHD Risk.        ATP III CLASSIFICATION (LDL):  <100     mg/dL   Optimal  604-540  mg/dL   Near or Above                    Optimal  130-159  mg/dL   Borderline  981-191  mg/dL   High  >478     mg/dL   Very High Performed at [facility]     Physical Findings: AIMS: Facial and Oral Movements Muscles of Facial Expression: None, normal Lips and Perioral Area: None, normal Jaw: None,  normal Tongue: None, normal,Extremity Movements Upper (arms, wrists, hands, fingers): None, normal Lower (legs, knees, ankles, toes): None, normal, Trunk Movements Neck, shoulders, hips: None, normal, Overall  Severity Severity of abnormal movements (highest score from questions above): None, normal Incapacitation due to abnormal movements: None, normal Patient's awareness of abnormal movements (rate only patient's report): No Awareness, Dental Status Current problems with teeth and/or dentures?: No Does patient usually wear dentures?: No  CIWA:  CIWA-Ar Total: 7 COWS:     Treatment Plan Summary: Daily contact with patient to assess and evaluate symptoms and progress in treatment, Medication management and Plan Haldol and Cogentin scheduled.  Zyprexa PRN Review of chart, vital signs, medications, and notes.  1-Individual and group therapy  2-Medication management for depression and anxiety: Medications reviewed with the patient and she stated no untoward effects, unchanged.  3-Coping skills for depression, anxiety  4-Continue crisis stabilization and management  5-Address health issues--monitoring vital signs, stable  6-Treatment plan in progress to prevent relapse of depression and anxiety  Medical Decision Making:  Review of Psycho-Social Stressors (1), Review and summation of old records (2), Review or order medicine tests (1) and Review of Medication Regimen & Side Effects (2) Problem Points:  Established problem, worsening (2) and Review of psycho-social stressors (1) Data Points:  Discuss tests with performing physician (1) Review and summation of old records (2) Review of medication regiment & side effects (2)  AGUSTIN, SHEILA MAY, AGNP-BC 03/10/2014, 9:29 AM  I agreed with the findings, treatment and disposition plan of this patient. Kathryne SharperSyed Pius Byrom, MD

## 2014-03-10 NOTE — Progress Notes (Addendum)
RN 1:1 Note D: Writer introduced self to pt. [facility] Pt in room asking about her medications. Pt repeatedly asked writer about her medications. Pt informed that this writer will administer her medications once the chart/MAR is available for access by this Clinical research associatewriter. A: 1:1 observations for pt's safety remains. R: Pt remains safe at this time.

## 2014-03-11 DIAGNOSIS — F19239 Other psychoactive substance dependence with withdrawal, unspecified: Secondary | ICD-10-CM | POA: Diagnosis present

## 2014-03-11 DIAGNOSIS — F19980 Other psychoactive substance use, unspecified with psychoactive substance-induced anxiety disorder: Principal | ICD-10-CM

## 2014-03-11 DIAGNOSIS — F19280 Other psychoactive substance dependence with psychoactive substance-induced anxiety disorder: Secondary | ICD-10-CM

## 2014-03-11 MED ORDER — OLANZAPINE 5 MG PO TBDP
5.0000 mg | ORAL_TABLET | ORAL | Status: DC
  Filled 2014-03-11 (×6): qty 1

## 2014-03-11 MED ORDER — LORAZEPAM 1 MG PO TABS
1.0000 mg | ORAL_TABLET | ORAL | Status: AC | PRN
  Administered 2014-03-11: 1 mg via ORAL
  Filled 2014-03-11: qty 1

## 2014-03-11 MED ORDER — ZIPRASIDONE MESYLATE 20 MG IM SOLR: 20.0000 mg | INTRAMUSCULAR | Status: DC | PRN

## 2014-03-11 MED ORDER — ZIPRASIDONE MESYLATE 20 MG IM SOLR
INTRAMUSCULAR | Status: AC
  Administered 2014-03-11: 20 mg
  Filled 2014-03-11: qty 20

## 2014-03-11 MED ORDER — HALOPERIDOL 5 MG PO TABS
5.0000 mg | ORAL_TABLET | Freq: Four times a day (QID) | ORAL | Status: DC | PRN
  Administered 2014-03-11: 5 mg via ORAL

## 2014-03-11 MED ORDER — TRAZODONE HCL 100 MG PO TABS
100.0000 mg | ORAL_TABLET | Freq: Every day | ORAL | Status: DC
  Filled 2014-03-11 (×2): qty 1

## 2014-03-11 MED ORDER — DIVALPROEX SODIUM ER 250 MG PO TB24
250.0000 mg | ORAL_TABLET | ORAL | Status: DC
  Filled 2014-03-11 (×4): qty 1

## 2014-03-11 MED ORDER — BENZTROPINE MESYLATE 1 MG PO TABS
1.0000 mg | ORAL_TABLET | Freq: Four times a day (QID) | ORAL | Status: DC | PRN
  Administered 2014-03-11: 1 mg via ORAL

## 2014-03-11 MED ORDER — OLANZAPINE 10 MG PO TBDP: 10.0000 mg | ORAL_TABLET | Freq: Three times a day (TID) | ORAL | Status: DC | PRN

## 2014-03-11 NOTE — Progress Notes (Signed)
RN 1:1 NOTE  D: Patient denies SI/HI; patient reports pain all over and physician made aware; patient stated " Can I have morphine, adderall, and oxy"; it was explained to the patient that the doctor had not ordered these medications;   A: Continue 1: observation  R: Patient is taking medications appropriately and patient is tolerating medications; patient is intrusive and lacks boundaries but can be redirected; patient is going into others room and pulling off clothes at inappropriate times; patient needs constant redirection and was starting to get very agitated and shake when another patient on the hall started to escalate; patient was redirected with some deep breathing techniques and offered a bath; patient was able to go to sleep after talking with her about laying down to rest; patient did not sleep very long and is in the social worker group; patient is on the phone after group and constantly calling her husband about coming in at 1530 hrs during lunch the patient states; Clinical research associatewriter spoke with the husband [name] and was informed that he has been coming in to visit this patient during lunch; patient is being redirected and will informed the [facility] and [facility] about what response to take

## 2014-03-11 NOTE — [facility] Suicide Risk Assessment (Signed)
[facility] INPATIENT:  Family/Significant Other Suicide Prevention Education  Suicide Prevention Education:  Education Completed; Olivia Kim, husband, [phone] was not interested in hearing about SPE when I called.  Rather, he was demanading to be able to remove pt from hospital as he was unhappy with her care here, feels that in order to sleep she just needs to come home, and if there are any further problems with which he needs help, he will certainly [facility] rather than bring her back to us. has been identified by the patient as the family member/significant other with whom the patient will be residing, and identified as the person(s) who will aid the patient in the event of a mental health crisis (suicidal ideations/suicide attempt).  With written consent from the patient, the family member/significant other has been provided the following suicide prevention education, prior to the and/or following the discharge of the patient.  The suicide prevention education provided includes the following:  Suicide risk factors  Suicide prevention and interventions  National Suicide Hotline telephone number  [facility] assessment telephone number  [facility] Assistance 911  [facility] and/or Residential Mobile Crisis Unit telephone number  Request made of family/significant other to:  Remove weapons (e.g., guns, rifles, knives), all items previously/currently identified as safety concern.    Remove drugs/medications (over-the-counter, prescriptions, illicit drugs), all items previously/currently identified as a safety concern.  The family member/significant other verbalizes understanding of the suicide prevention education information provided.  The family member/significant other agrees to remove the items of safety concern listed above.  Daryel Geraldorth, Elmin Wiederholt B 03/11/2014, 3:30 PM

## 2014-03-11 NOTE — Discharge Summary (Signed)
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 R/O Bipolar I disorder, most recent episode (or current) manic [F31.10] 03/10/2014  . Psychoses [F29]   . Substance use disorder [F19.90] 03/08/2014   Musculoskeletal: Strength & Muscle Tone: within normal limits Gait & Station: normal Patient leans: N/A  Psychiatric Specialty Exam: Physical Exam  Psychiatric: Her speech is normal and behavior is normal. Judgment and thought content normal. Her mood appears not anxious. Her affect is not angry, not blunt, not labile and not inappropriate. Cognition and memory are normal. She does not exhibit a depressed mood.    Review of Systems  Constitutional: Negative.   HENT: Negative.   Eyes: Negative.   Respiratory: Negative.   Cardiovascular: Negative.   Gastrointestinal: Negative.   Genitourinary: Negative.   Musculoskeletal: Negative.   Skin: Negative.   Neurological: Negative.   Endo/Heme/Allergies: Negative.   Psychiatric/Behavioral: Positive for hallucinations (Hx of psychosis) and substance abuse (HX of). Negative for suicidal ideas and memory loss. Depression: Pt is leaving AMA. The patient has insomnia. The patient is not nervous/anxious.     Blood pressure 130/85, pulse 87, temperature 98 F (36.7 C), temperature source Oral, resp. rate 18, height  (1.626 m), weight 79.379 kg  (175 lb), SpO2 100 %.Body mass index is 30.02 kg/(m^2).  See Md's SRA     Past Medical History:  Past Medical History  Diagnosis Date  . Anxiety   . Depression    History reviewed. No pertinent past surgical history. Family History:  Family History  Problem Relation Age of Onset  . Bipolar disorder Mother   . ADD / ADHD Mother    Social History:  History  Alcohol Use  . Yes    Comment: cough syrup; pt reports drinking wine once a month     History  Drug Use  . Yes  . Special: Other-see comments    Comment: cough syrup    History   Social History  . Marital Status: Married    Spouse Name: N/A    Number of Children: N/A  . Years of Education: N/A   Social History Main Topics  . Smoking status: Never Smoker   . Smokeless tobacco: None  . Alcohol Use: Yes     Comment: cough syrup; pt reports drinking wine once a month  . Drug Use: Yes    Special: Other-see comments     Comment: cough syrup  . Sexual Activity: Yes    Birth Control/ Protection: Injection   Other Topics Concern  . None   Social History Narrative    Risk to Self: Is patient at risk for suicide?: Yes What has been your use of drugs/alcohol within the last 12 months?: Pt has been abusing cough syrup and stimulants Risk to Others:   Prior Inpatient Therapy:   Prior Outpatient Therapy:    Level of Care:  OP  Hospital Course:  Olivia Kim is an 24 y.o. female that [facility] threatening suicide with a knife that had to be taken away by her husband last night per her husband and grandmother that were present for initial assessment. Pt on initial assessment per EHR was oriented to person only, had rambling, tangential, and incoherent speech.[facility] and pt discharged because she was able to contract for safety. Husband and grandmother have been taking care of the pt at home. The pt reported she has been drinking cough syrup for one year and  husband recently found out. Patient also drinks wine every other day .   Olivia Kim has been receiving mood stabilization treatment since her admission to this hospital on 03/07/13. She was being medicated with cogentin 1 mg for prevention of drug induced EPS, Depakote ER 250 mg for mood stabilization, Haldol 5 mg prn for severe agitation, Lorazepam 1 mg for anxiety, Olanzapine Zydis 5 mg for mood control, Sertraline 25 mg for depression and Trazodone 100 mg for sleep. She was also enrolled in the group counseling sessions being offered and held on this unit to learn coping skills that should help her cope better to maintain mood stability.  However, Olivia RivesClareth is being discharged today against medical advice. She, with the support of her husband has decided to go home. Patient's husband stated per social worker that he is taking his wife home to care and comfort her at their home. She declines any follow-up care and or referral. See Md's suicide risks assessment.  Consults:  psychiatry  Significant Diagnostic Studies:  labs: CBC with diff, CMP, UDS, toxicology tests, U/A, results reviewed, no changes  Discharge Vitals:   Blood pressure 130/85, pulse 87, temperature 98 F (36.7 C), temperature source Oral, resp. rate 18, height 5\' 4"  (1.626 m), weight 79.379 kg (175 lb), SpO2 100 %. Body mass index is 30.02 kg/(m^2). Lab Results:   Results for orders placed or performed during the hospital encounter of 03/07/14 (from the past 72 hour(s))  Hemoglobin A1c     Status: None   Collection Time: 03/10/14  6:31 AM  Result Value Ref Range   Hgb A1c MFr Bld 5.2 <5.7 %    Comment: (NOTE)                                                                       According to the ADA Clinical Practice Recommendations for 2011, when HbA1c is used as a screening test:  >=6.5%   Diagnostic of Diabetes Mellitus           (if abnormal result is confirmed) 5.7-6.4%   Increased risk of developing Diabetes  [place] References:Diagnosis and Classification of Diabetes Mellitus,Diabetes Care,2011,34(Suppl 1):S62-S69 and Standards of Medical Care in         Diabetes - 2011,Diabetes Care,2011,34 (Suppl 1):S11-S61.    Mean Plasma Glucose 103 <117 mg/dL    Comment: Performed at [facility]  Lipid panel     Status: None   Collection Time: 03/10/14  6:31 AM  Result Value Ref Range   Cholesterol 139 0 - 200 mg/dL   Triglycerides 43 <161<150 mg/dL   HDL 62 >09>39 mg/dL   Total CHOL/HDL Ratio 2.2 RATIO   VLDL 9 0 - 40 mg/dL  LDL Cholesterol 68 0 - 99 mg/dL    Comment:        Total Cholesterol/HDL:CHD Risk Coronary Heart Disease Risk Table                     Men   Women  1/2 Average Risk   3.4   3.3  Average Risk       5.0   4.4  2 X Average Risk   9.6   7.1  3 X Average Risk  23.4   11.0        Use the calculated Patient Ratio above and the CHD Risk Table to determine the patient's CHD Risk.        ATP III CLASSIFICATION (LDL):  <100     mg/dL   Optimal  161-096  mg/dL   Near or Above                    Optimal  130-159  mg/dL   Borderline  045-409  mg/dL   High  >811     mg/dL   Very High Performed at [facility]   TSH     Status: None   Collection Time: 03/10/14  6:31 AM  Result Value Ref Range   TSH 2.782 0.350 - 4.500 uIU/mL    Comment: Performed at [facility]    Physical Findings: AIMS: Facial and Oral Movements Muscles of Facial Expression: None, normal Lips and Perioral Area: None, normal Jaw: None, normal Tongue: None, normal,Extremity Movements Upper (arms, wrists, hands, fingers): None, normal Lower (legs, knees, ankles, toes): None, normal, Trunk Movements Neck, shoulders, hips: None, normal, Overall Severity Severity of abnormal movements (highest score from questions above): None, normal Incapacitation due to abnormal movements: None, normal Patient's awareness of abnormal movements (rate only patient's report): No Awareness, Dental  Status Current problems with teeth and/or dentures?: No Does patient usually wear dentures?: No  CIWA:  CIWA-Ar Total: 6 COWS:     See Psychiatric Specialty Exam and Suicide Risk Assessment completed by Attending Physician prior to discharge.  Discharge destination:  Home  Is patient on multiple antipsychotic therapies at discharge:  No   Has Patient had three or more failed trials of antipsychotic monotherapy by history:  No  Recommended Plan for Multiple Antipsychotic Therapies: NA    Medication List    STOP taking these medications        amoxicillin 875 MG tablet  Commonly known as:  AMOXIL     cyclobenzaprine 5 MG tablet  Commonly known as:  FLEXERIL     medroxyPROGESTERone 150 MG/ML injection  Commonly known as:  DEPO-PROVERA     nitrofurantoin (macrocrystal-monohydrate) 100 MG capsule  Commonly known as:  MACROBID       Follow-up Information    Follow up with Pt declined follow up.    Follow-up recommendations:  Activity:  As tolerated Diet: As recommended by your primary care doctor. Keep all scheduled follow-up appointments as recommended.  Comments:  Take all your medications as prescribed by your mental healthcare provider. Report any adverse effects and or reactions from your medicines to your outpatient provider promptly. Patient is instructed and cautioned to not engage in alcohol and or illegal drug use while on prescription medicines. In the event of worsening symptoms, patient is instructed to call the crisis hotline, 911 and or go to the nearest ED for appropriate evaluation and treatment of symptoms. Follow-up with your primary care provider for  your other medical issues, concerns and or health care needs.   Total Discharge Time: Greater than 30 minutes  Signed: Sanjuana Kava, PMHNP, FNP-BC 03/11/2014, 4:14 PM

## 2014-03-11 NOTE — Progress Notes (Signed)
  [facility] :  Will you be returning to the same living situation after discharge:  Yes,   home At discharge, do you have transportation home?: Yes,  husband Do you have the ability to pay for your medications: Yes,  no meds prescribed  Release of information consent forms completed and in the chart;  Patient's signature needed at discharge.  Patient to Follow up at: Follow-up Information    Follow up with Pt declined follow up.      Patient denies SI/HI: Yes,  yes    Safety Planning and Suicide Prevention discussed: Yes,  yes  Has patient been referred to the Quitline?: N/A patient is not a smoker  [place], Olivia Kim 03/11/2014, 3:27 PM

## 2014-03-11 NOTE — [facility] Group Notes (Signed)
[facility]   03/11/2014 11:15 AM  Participation Quality:  Interrupting  Mood/Affect:  Sedated  Depression Rating:  unk  Anxiety Rating:  unk  Thoughts of Suicide:  No Will you contract for safety?   NA  Current AVH:  No  Plan for Discharge/Comments:  Apologized to the group for disruptive behavior.  Sedated, slurring words.  Came in and out several times with 1:1 staff member.  Asked for morphine.  The group members expressed both concern for, and frustration with her.  They talked about husband who has been here daily to see her, and how he obviously cares for her very much.  Transportation Means: husband  Supports: husband  Kiribatiorth, Altamont[place]ney B

## 2014-03-11 NOTE — Progress Notes (Signed)
RN 1:1 Note  D: Pt continues to need constant re-direction from staff. 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 "Do I have a brain tumor"? Pt was informed that there were no acute findings to suggest any emergent interventions. Pt also requesting an IM injection. Pt was informed that the on-call extender is reviewing her chart now for an order. Pt pulls down her pant. Writer encourage pt to pull up her pants until I returned with the med. Pt then ambulated to the bathroom to urinate.   A: 1:1 observations remains for pt's safety.  R: Pt remains safe at this time.

## 2014-03-11 NOTE — Progress Notes (Signed)
Writer called emergency contact Gertie BaronJustin Kanady ( husband) to alert about the CIRT that occurred with the patient.This phone call was made at 1036 hrs.
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
## 2014-03-11 NOTE — Tx Team (Signed)
  Interdisciplinary Treatment Plan Update   Date Reviewed:  03/11/2014  Time Reviewed:  3:39 PM  Progress in Treatment:   Attending groups: Yes Participating in groups: Yes Taking medication as prescribed: Yes  Tolerating medication: Yes Family/Significant other contact made: Yes  Patient understands diagnosis: Yes  Discussing patient identified problems/goals with staff: Yes  See initial care plan Medical problems stabilized or resolved: Yes Denies suicidal/homicidal ideation: Yes  In tx team Patient has not harmed self or others: Yes  For review of initial/current patient goals, please see plan of care.  Estimated Length of Stay:  D/C today  Reason for Continuation of Hospitalization:   New Problems/Goals identified:  N/A  Discharge Plan or Barriers:   return home, pt declined follow up  Additional Comments:  Attendees:  Signature: Ivin BootySarama Eappen, MD 03/11/2014 3:39 PM   Signature: Richelle Itood Caetano Oberhaus, LCSW 03/11/2014 3:39 PM  Signature: Fransisca KaufmannLaura Davis, NP 03/11/2014 3:39 PM  Signature:  03/11/2014 3:39 PM  Signature: Leighton ParodyBritney Tyson, RN 03/11/2014 3:39 PM  Signature:  03/11/2014 3:39 PM  Signature:   03/11/2014 3:39 PM  Signature:    Signature:    Signature:    Signature:    Signature:    Signature:      Scribe for Treatment Team:   Richelle Itood Britanny Marksberry, LCSW  03/11/2014 3:39 PM

## 2014-03-11 NOTE — Progress Notes (Signed)
Writer informed by Dr. Elna BreslowEappen to allow patient to sign a voluntary consent. Dr. Elna BreslowEappen also informed this writer that if the patient continues to ask about discharge that she can sign the against medical advice form. AC Tomi LikensEric K, RN and Victorio Palmebra M, RN ( Director of the Adult Unit) made aware of this situation and instructed to proceed as Dr. Elna BreslowEappen ordered. Patient signed voluntary consent in the presence of Hana J. MHT and this Clinical research associatewriter. Patient signed the AMA form in the presence of her husband and witness by this Clinical research associatewriter. Patient signature on the AMA form was shown to the [facility] Eric K. RN. Patient verbalized and signed that she received all belongings from room and locker 6.  Patient 1:1 discontinued upon discharged. Patient was escorted by this Clinical research associatewriter and KelloggHana J. MHT to front lobby.  Patient left the unit ambulatory.

## 2014-03-11 NOTE — Progress Notes (Signed)
D: Pt is on the phone calling her Husband.  A: 1:1 observation remains for pt's safety. R: Pt remains safe at this time.
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
## 2014-03-11 NOTE — [facility] Suicide Risk Assessment (Signed)
[facility]   Demographic Factors:  NA  Total Time spent with patient: 30 minutes  Musculoskeletal: Strength & Muscle Tone: within normal limits Gait & Station: normal Patient leans: N/A  Psychiatric Specialty Exam: Physical Exam Patient not cooperative  Review of Systems  Unable to perform ROS   Blood pressure 132/55, pulse 82, temperature 98.3 F (36.8 C), temperature source Oral, resp. rate 16, height 5\' 4"  (1.626 m), weight 79.379 kg (175 lb), SpO2 100 %.Body mass index is 30.02 kg/(m^2).  General Appearance: Fairly Groomed  Patent attorneyye Contact::  Fair  Speech:  Garbled at times, due to medications given prn  Volume:  Normal  Mood:  Anxious and Dysphoric  Affect:  Congruent  Thought Process:  Irrelevant  Orientation:  Full (Time, Place, and Person)  Thought Content:  WDL  Suicidal Thoughts:  No  Homicidal Thoughts:  No  Memory:  Immediate;   Fair Recent;   Fair Remote;   Fair  Judgement:  Impaired  Insight:  Shallow  Psychomotor Activity:  Restlessness  Concentration:  Fair  Recall:  FiservFair  Fund of Knowledge:Fair  Language: Fair  Akathisia:  No  Handed:  Right  AIMS (if indicated):     Assets:  Physical Health Social Support  Sleep:  Number of Hours: 2.25  Cognition: Impaired,  Mild  ADL's:  Intact   Have you used any form of tobacco in the last 30 days? (Cigarettes, Smokeless Tobacco, Cigars, and/or Pipes): No  Has this patient used any form of tobacco in the last 30 days? (Cigarettes, Smokeless Tobacco, Cigars, and/or Pipes) No  Mental Status Per Nursing Assessment::   On Admission:  Self-harm behaviors  Current Mental Status by Physician: Patient is alert and oriented x3 , patient however has been having periods of agitation secondary to not being able to call her husband ,wanting discharge from hospital ,which are more likely behavioral. Patient also has garbled speech at times due to prn medications offered early AM as well as later  today ,since she was agitated on the unit.  Loss Factors: NA  Historical Factors: Impulsivity  Risk Reduction Factors:   Positive social support  Continued Clinical Symptoms:  Alcohol/Substance Abuse/Dependencies  Cognitive Features That Contribute To Risk:  Closed-mindedness, Polarized thinking and Thought constriction (tunnel vision)    Suicide Risk:  Acute risk for suicide is Minimal: No identifiable suicidal ideation.   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 R/O Bipolar I disorder, most recent episode (or current) manic [F31.10] 03/10/2014  . Psychoses [F29]   . Substance use disorder [F19.90] 03/08/2014      Plan Of Care/Follow-up recommendations: Patient signed out AMA. Activity:  Patient signed out AMA   Is patient on multiple antipsychotic therapies at discharge:  No   Has Patient had three or more failed trials of antipsychotic monotherapy by history:  No  Recommended Plan for Multiple Antipsychotic Therapies: NA  Olivia Schumpert md 03/11/2014, 1:01 PM

## 2014-03-12 LAB — COMPREHENSIVE METABOLIC PANEL
ALBUMIN: 3.6 g/dL (ref 3.4–5.0)
ALK PHOS: 61 U/L (ref 46–116)
Anion Gap: 9 (ref 7–16)
BUN: 13 mg/dL (ref 7–18)
Bilirubin,Total: 0.3 mg/dL (ref 0.2–1.0)
CALCIUM: 8.5 mg/dL (ref 8.5–10.1)
Chloride: 108 mmol/L — ABNORMAL HIGH (ref 98–107)
Co2: 21 mmol/L (ref 21–32)
Creatinine: 0.77 mg/dL (ref 0.60–1.30)
EGFR (African American): >60
GLUCOSE: 100 mg/dL — AB (ref 65–99)
OSMOLALITY: 276 (ref 275–301)
Potassium: 4.2 mmol/L (ref 3.5–5.1)
SGOT(AST): 27 U/L (ref 15–37)
SGPT (ALT): 38 U/L (ref 14–63)
SODIUM: 138 mmol/L (ref 136–145)
Total Protein: 7.2 g/dL (ref 6.4–8.2)

## 2014-03-12 LAB — CBC
HCT: 39.5 % (ref 35.0–47.0)
HGB: 13.2 g/dL (ref 12.0–16.0)
MCH: 30.7 pg (ref 26.0–34.0)
MCHC: 33.5 g/dL (ref 32.0–36.0)
MCV: 92 fL (ref 80–100)
Platelet: 280 10*3/uL (ref 150–440)
RBC: 4.3 10*6/uL (ref 3.80–5.20)
RDW: 12.8 % (ref 11.5–14.5)
WBC: 10.1 10*3/uL (ref 3.6–11.0)

## 2014-03-12 LAB — URINALYSIS, COMPLETE
Bilirubin,UR: NEGATIVE
Blood: NEGATIVE
Glucose,UR: NEGATIVE mg/dL (ref 0–75)
KETONE: NEGATIVE
LEUKOCYTE ESTERASE: NEGATIVE
NITRITE: NEGATIVE
PH: 6 (ref 4.5–8.0)
Protein: NEGATIVE
Specific Gravity: 1.026 (ref 1.003–1.030)
Squamous Epithelial: 1

## 2014-03-12 LAB — DRUG SCREEN, URINE

## 2014-03-12 LAB — TSH: Thyroid Stimulating Horm: 0.728 u[IU]/mL

## 2014-03-12 LAB — ACETAMINOPHEN LEVEL: Acetaminophen: <2 ug/mL

## 2014-03-12 LAB — SALICYLATE LEVEL

## 2014-03-12 LAB — ETHANOL: Ethanol: <3 mg/dL

## 2014-03-13 ENCOUNTER — Inpatient Hospital Stay: Payer: Self-pay | Admitting: Psychiatry

## 2014-03-14 LAB — LITHIUM LEVEL: Lithium: 0.55 mmol/L — ABNORMAL LOW

## 2014-03-14 NOTE — Progress Notes (Signed)
Patient Discharge Instructions:  No documentation was faxed for HBIPS.  Per the SW the patient declined follow up.  Olivia ReddenSheena E [place], 03/14/2014, 2:17 PM

## 2014-03-18 LAB — HEMOGLOBIN A1C: HEMOGLOBIN A1C: 5.6 % (ref 4.2–6.3)

## 2014-03-18 LAB — LIPID PANEL
Cholesterol: 126 mg/dL (ref 0–200)
HDL Cholesterol: 74 mg/dL — ABNORMAL HIGH (ref 40–60)
Ldl Cholesterol, Calc: 44 mg/dL (ref 0–100)
Triglycerides: 42 mg/dL (ref 0–200)
VLDL Cholesterol, Calc: 8 mg/dL (ref 5–40)

## 2014-03-18 LAB — LITHIUM LEVEL: Lithium: 0.55 mmol/L — ABNORMAL LOW

## 2014-03-18 LAB — TSH: THYROID STIMULATING HORM: 1.96 u[IU]/mL

## 2014-06-16 NOTE — H&P (Signed)
PATIENT NAME:  Olivia Kim, Olivia Kim MR#:  [id number] DATE OF BIRTH:  1990/12/24  DATE OF ADMISSION:  03/13/2014  REFERRING PHYSICIAN: Emergency Room MD   ATTENDING PHYSICIAN: Shari Prows, MD   IDENTIFYING DATA: Ms. Bethune is a 24 year old female with new onset psychosis.   CHIEF COMPLAINT: "I do not remember."  HISTORY OF PRESENT ILLNESS: Ms. Wragg graduated from [employer], last December. She was concerned about her weight and started using appetite suppressant. She discovered dextromethorphan and started abusing syrups that contain this compound. She discovered that it gives her a pleasant feeling without worries about calories. It is uncertain how much she has been using or for how long. In addition to the DXM, she has tried other products that she found on the internet that was supposed to help her lose weight.  Two weeks ago or so she was admitted to [facility] for what appears to be stimulant-induced manic episode; she was discharged 1 week ago. The patient does not remember anything about her hospitalization at [facility] and does not know whether medications were prescribed or what they were called. Her husband is unable to provide any information except that he felt that the patient was over sedated at [facility]. It is unclear if the patient continued to use DXM or other substances during the hiatus. She certainly did not take any medication. She was brought to the Emergency Room disorganized and suicidal. She complained to the intake person that she is ugly and fat and needs to die. She reports that prior to admission, she has been unable to sleep, pacing all night long, frightened, and behaving strangely. Her thinking was completely disorganized. She was unable to recall events, appeared confused. Her husband brought her to [facility] Emergency Room.   The patient is very talkative, giddy, intrusive, disorganized, unable to  focus on 1 topic with racing thoughts and tangentiality.  She is very talkative, she is happy, she giggles inappropriately. She denies hearing voices or seeing things. She does not appear paranoid, but she is somewhat grandiose and has no filter, is still too preoccupied with weight loss and her looks. She is unable to tell us whether or not she used substances lately. She denies drinking alcohol as it has too many calories for her. She denies any anxiety. She reports that she has been struggling with weight for a long time. She is unable to maintain any weight loss that she was able to accomplish with moderate exercise or dieting. She thinks that she is addicted to food. A few times she did induce vomiting but this is not continuous habit. She does not use laxatives or diuretics. She does not exercise.   PAST PSYCHIATRIC HISTORY: Nothing really up until recent hospitalization. She never attempted a suicide. No substance abuse problems up until recently.   FAMILY PSYCHIATRIC HISTORY: Her mother who abandoned her at birth has bipolar disorder very severe form.   PAST MEDICAL HISTORY: None.   ALLERGIES: No known drug allergies.   MEDICATIONS ON ADMISSION: None.   SOCIAL HISTORY: She is married.  She graduated from [employer]. When she was in school she did get some counseling, but nothing recently. Her does admit of using appetite suppressant especially cough syrups containing DXM. She said that lately she has been experimenting with other syrups, including something she calls orange. She notes for example that Robitussin does not have the desired mind altering affect. She plans to go to Veterinary  School at [employer], or to be a Glass blower/designerhuman anesthesiologist or a Systems analystpersonal trainer.   REVIEW OF SYSTEMS:   CONSTITUTIONAL: No fevers or chills, positive for gradual weight gain.  EYES: No double or blurred vision.  ENT: No hearing loss.    RESPIRATORY: No shortness of breath or cough.  CARDIOVASCULAR: No chest pain or  orthopnea.  GASTROINTESTINAL: No abdominal pain, nausea, vomiting, or diarrhea.  GENITOURINARY: No incontinence or frequency.  ENDOCRINE: No heat or cold intolerance.  LYMPHATIC: No anemia or easy bruising.  INTEGUMENTARY: No acne or rash.  MUSCULOSKELETAL: No muscle or joint pain.  NEUROLOGIC: No tingling or weakness.  PSYCHIATRIC: See history of present illness for details.   PHYSICAL EXAMINATION: VITAL SIGNS: Blood pressure 114/74, pulse 64, respirations 18, temperature 97.6.  GENERAL: This is an obese young female in no acute distress.  HEENT: The pupils are equal, round, and reactive to light. Sclerae are anicteric  NECK: Supple. No thyromegaly.  LUNGS: Clear to auscultation. No dullness to percussion.  HEART: Regular rhythm and rate. No members, rubs, or gallops.  ABDOMEN: Soft, nontender, nondistended. Positive bowel sounds.  MUSCULOSKELETAL: Normal muscle strength in all extremities.  SKIN: No rashes or bruises.  LYMPHATIC: No cervical adenopathy.  NEUROLOGIC: Cranial nerves II through XII are intact.   DIAGNOSTIC DATA: Chemistries are within normal limits, LFTs within normal limits, TSH 0.728. Urine tox screen is negative for substances. CBC within normal limits. Urinalysis is not suggestive of urinary tract infection. Serum acetaminophen and salicylate are low. EKG: Normal sinus rhythm with sinus arrhythmia and T wave abnormalities, consider anterior ischemia, abnormal EKG. Urine pregnancy test is negative.   MENTAL STATUS EXAMINATION: On admission, the patient is alert and oriented to person and place only. She knows it is 2016. She is utterly confused about the situation. She is well groomed and casually dressed. She is pleasant, polite and cooperative but intrusive and very disorganized. She maintains good eye contact. She is well groomed and casually dressed. Her speech is loud and pressured at times. Her mood is good with expansive affect. Thought process is illogical. There  are racing thoughts, flight of ideas and tangentiality. She denies thoughts of hurting herself or others. She may be slightly paranoid, but there are no auditory or visual hallucinations. Her cognition is grossly intact but difficult to assess formally today. She is of average intelligence and fund of knowledge. Her insight and judgment are very limited today.   SUICIDE RISK ASSESSMENT ON ADMISSION: This is a patient with new onset psychosis who is very disorganized and unable to care for herself, but also unable to plan or execute a suicide attempt.   INITIAL DIAGNOSES:   AXIS I: Bipolar I disorder, mania with psychosis, Opiod use disorder, severe.   AXIS II: Deferred.   AXIS III: Obesity.   PLAN: The patient was admitted to [facility] unit for safety, stabilization and medication management.  1.  Mood and psychosis. She was started on lithium in the Emergency Room and after 1 dose she already feels that it is helping her even though it less than ideal medication for someone with weight issues, we will continue lithium and recheck the level. We will start low-dose Risperdal and will titrate as tolerated for mood stability.  2.  Smoking. The patient will receive nicotine products.  3.  Insomnia.  I will offer Restoril for sleep, the patient has not slept in days.  4.  Disposition. She will be discharged to  home with her husband.    ____________________________ Ellin Goodie. Jennet Maduro, MD jbp:nt D: 03/14/2014 20:29:02 ET T: 03/14/2014 21:41:55 ET JOB#: [id number]  cc: Payden Bonus B. Jennet Maduro, MD, <Dictator> Shari Prows MD ELECTRONICALLY SIGNED 03/31/2014 21:45

## 2014-06-16 NOTE — Consult Note (Signed)
PATIENT NAME:  Olivia Kim, [name] MR#:  [id number] DATE OF BIRTH:  1990/09/25  DATE OF CONSULTATION:  03/12/2014  CONSULTING PHYSICIAN:  Audery AmelJohn T. Clapacs, MD  IDENTIFYING INFORMATION AND REASON FOR CONSULTATION: A 24 year old woman with apparently a history of new onset psychiatric symptoms, who was brought to the hospital by her husband. Her chief complaint is "I'm depressed, I am hyperthyroid, and I think I need some Adderall!"   HISTORY OF PRESENT ILLNESS: Information obtained from the patient and the chart. The patient is manic, and having flight of ideas and is not the greatest historian. She indicates that her husband brought her into the hospital, but she cannot initially tell me why. Later, she says that she tried to cut herself with a kitchen knife. She shows me several small punctures on her wrists and says that she was trying to cut herself open, but her husband stopped her. She says she has not been sleeping for some time, although she estimates that as being 2 years. She describes her mood as being depressed. Talks about how she believes that she is so ugly that she ought to die.  All of these things however, go by very quickly with no elaboration and she immediately switches to new topics. She denies that she is having any hallucinations. She denies that she has been using any kind of illicit drugs recently. Says that she has taken cough syrup as a recreational drugs in the past, but says she has not had any since last week. She is somewhat evasive about her drinking, but eventually when she describes it, it sounds like she has not been drinking recently, nor heavily. She apparently was recently admitted to a psychiatric hospital, [facility] about a week ago by her estimate. She does not remember what medicines they prescribed, but says that whatever they were, she has not been taking them since getting out of the hospital.   PAST PSYCHIATRIC HISTORY:  Evidently was  admitted to [facility] recently, presumably for similar symptoms. She claims to now know what she was diagnosed with or what medicine she was given. Has not been taking them since she got out. She is not able to really give a clear time course as to how long her symptoms have been going on. Cannot name any previous medicines she has ever been on in the past.   SOCIAL HISTORY: The patient is married. Has no children. Husband evidently brought her into the hospital. She says that she is both working and attending school. Says on several occasions that she wants to be a International aid/development workerveterinarian. She is originally from [place], sounds like she may have other family in the area.   PAST MEDICAL HISTORY: Denies knowing of any significant other medical problems. She talks about how she thinks she is hyperthyroid, but clearly does not have any real rational reason to think that.   SUBSTANCE ABUSE HISTORY: Again, this is hard to piece together, but it sounds like she had abused cough syrup at least 1 time in the past, but says she has not done that in the last couple of days and that she has not been drinking in the last few days.   FAMILY HISTORY: States that her mother has bipolar disorder and schizophrenia.   CURRENT MEDICATIONS: Unknown, probably not taking any.   ALLERGIES: No known drug allergies.   REVIEW OF SYSTEMS: Denies any pain. Denies any specific physical symptoms. No cardiac, GI, or pulmonary symptoms. She talks about  on the one hand wishing that she were dead, but then says she is not intending to try and harm herself now. Denies homicidal ideation.   MENTAL STATUS EXAMINATION: Reasonably well-groomed woman, who looks her stated age. She attempts to be cooperative with the interview, but her illness makes her a poor historian. Eye contact is intense. Psychomotor activity is fidgety and demonstrative. Speech is rapid, pressured, and frequently loud. Affect is euphoric. She is euphoric and  expansive even when claiming to be depressed. Thoughts show constant flight of ideas. Cannot stay on a topic for more than a few words at a time. Even with redirection she cannot stay organized. Does not say anything obviously bizarre or delusional. Says that she is not having hallucinations now, but thinks that in the past she has probably had them. She is alert and oriented x 4. Can repeat 3 words immediately, remembers 3/3 at 3 minutes. Judgment and insight impaired. Intelligence appears to be normal.   LABORATORY RESULTS: Drug screen is all negative. Urinalysis is all normal. Pregnancy test is negative. TSH is normal. Alcohol, acetaminophen, and salicylates negative. Chemistry panel, really nothing significant, and the CBC is normal.   VITAL SIGNS: Blood pressure 115/65, respirations 24, pulse 75, temperature 98.   ASSESSMENT: A patient who is presenting with what appears to be bipolar disorder, manic or mixed symptoms. Differential could include substance-induced, although there does not seem to be any evidence of that right now. Could include medical, but the patient appears to be healthy. There is no sign of any neurologic impairment, that seems unlikely as well.   TREATMENT PLAN: The patient is under IVC. She will be admitted to psychiatry. Close elopement and suicide precautions in place. I have elected to start her on lithium initially at 600 mg at night. She has p.r.n. orders for Ativan for agitation. For some reason, she had been placed on the CIWA protocol in the Emergency Room, but from the laboratories and the history, I do not think there is any reason to think that she needs alcohol withdrawal and that will be discontinued.   DIAGNOSIS, PRINCIPAL AND PRIMARY: AXIS I:  Bipolar disorder, manic.   SECONDARY DIAGNOSES: AXIS I: Deferred.   AXIS II:  Deferred. AXIS III: Superficial small punctures to the wrist requiring no treatment.   ____________________________ Audery Amel,  MD jtc:LT D: 03/12/2014 20:14:28 ET T: 03/12/2014 20:56:07 ET JOB#: [id number]  cc: Audery Amel, MD, <Dictator> Audery Amel MD ELECTRONICALLY SIGNED 03/27/2014 17:23

## 2014-12-29 ENCOUNTER — Emergency Department (HOSPITAL_COMMUNITY): Payer: BLUE CROSS/BLUE SHIELD

## 2014-12-29 ENCOUNTER — Encounter (HOSPITAL_COMMUNITY): Payer: Self-pay | Admitting: Emergency Medicine

## 2014-12-29 ENCOUNTER — Emergency Department (HOSPITAL_COMMUNITY)
Admission: EM | Admit: 2014-12-29 | Discharge: 2014-12-29 | Disposition: A | Discharge status: Home / Self Care | Payer: BLUE CROSS/BLUE SHIELD | Attending: Emergency Medicine | Admitting: Emergency Medicine

## 2014-12-29 DIAGNOSIS — S6992XA Unspecified injury of left wrist, hand and finger(s), initial encounter: Secondary | ICD-10-CM | POA: Diagnosis present

## 2014-12-29 DIAGNOSIS — Y9289 Other specified places as the place of occurrence of the external cause: Secondary | ICD-10-CM | POA: Insufficient documentation

## 2014-12-29 DIAGNOSIS — W230XXA Caught, crushed, jammed, or pinched between moving objects, initial encounter: Secondary | ICD-10-CM | POA: Diagnosis not present

## 2014-12-29 DIAGNOSIS — Y998 Other external cause status: Secondary | ICD-10-CM | POA: Diagnosis not present

## 2014-12-29 DIAGNOSIS — S60022A Contusion of left index finger without damage to nail, initial encounter: Secondary | ICD-10-CM | POA: Diagnosis not present

## 2014-12-29 DIAGNOSIS — Z8659 Personal history of other mental and behavioral disorders: Secondary | ICD-10-CM | POA: Insufficient documentation

## 2014-12-29 DIAGNOSIS — Y9389 Activity, other specified: Secondary | ICD-10-CM | POA: Diagnosis not present

## 2014-12-29 MED ORDER — HYDROCODONE-ACETAMINOPHEN 5-325 MG PO TABS
2.0000 | ORAL_TABLET | Freq: Once | ORAL | Status: AC
  Administered 2014-12-29: 2 via ORAL
  Filled 2014-12-29: qty 2

## 2014-12-29 MED ORDER — HYDROCODONE-ACETAMINOPHEN 5-325 MG PO TABS: 2.0000 | ORAL_TABLET | ORAL | Status: AC | PRN

## 2014-12-29 MED ORDER — BACITRACIN ZINC 500 UNIT/GM EX OINT
1.0000 "application " | TOPICAL_OINTMENT | Freq: Two times a day (BID) | CUTANEOUS | Status: DC
  Administered 2014-12-29: 1 via TOPICAL
  Filled 2014-12-29: qty 0.9

## 2014-12-29 NOTE — Discharge Instructions (Signed)

## 2014-12-29 NOTE — ED Provider Notes (Signed)
CSN: [id number]     Arrival date & time 12/29/14  1258 History  By signing my name below, I, Olivia Kim, attest that this documentation has been prepared under the direction and in the presence of Olivia BerryLeisa Kennesha Brewbaker, Kim Electronically Signed: Soijett Kim, ED Scribe. 12/29/2014. 3:34 PM.   Chief Complaint  Patient presents with  . Hand Pain      The history is provided by the patient. No language interpreter was used.    Olivia Kim is a 24 y.o. female who presents to the Emergency Department complaining of moderate left pointer finger pain onset last night. She states that she shut her finger in a car door when she shut the door with one hand.  She reports that her pain has decreased since last night and she describes her pain as throbbing, intermittent, 5-6/10. Pt is having associated symptoms of joint swelling and mild abrasion. She notes that she has tried Rx tramadol and wound care with hydrogen peroxide for the relief of her symptoms. She has the Rx tramadol due to her left shoulder by Dr. Thedore MinsSingh at [facility] in [place]. She denies color change, rash, and any other symptoms.    Past Medical History  Diagnosis Date  . Anxiety   . Depression    No past surgical history on file. Family History  Problem Relation Age of Onset  . Bipolar disorder Mother   . ADD / ADHD Mother    Social History  Substance Use Topics  . Smoking status: Never Smoker   . Smokeless tobacco: None  . Alcohol Use: Yes     Comment: cough syrup; pt reports drinking wine once a month   OB History    No data available     Review of Systems  Musculoskeletal: Positive for joint swelling and arthralgias.  Skin: Positive for wound (abrasion to left pointer). Negative for color change and rash.    Allergies  Review of patient's allergies indicates no known allergies.  Home Medications   Prior to Admission medications   Medication Sig Start Date End Date Taking? Authorizing Provider   HYDROcodone-acetaminophen (NORCO/VICODIN) 5-325 MG tablet Take 2 tablets by mouth every 4 (four) hours as needed. 12/29/14   Olivia BerryLeisa Olivia Arterburn, Kim   BP 125/57 mmHg  Pulse 73  Temp(Src) 97.8 F (36.6 C) (Oral)  Resp 20  SpO2 96% Physical Exam  Constitutional: She is oriented to person, place, and time. She appears well-developed and well-nourished. No distress.  HENT:  Head: Normocephalic and atraumatic.  Right Ear: External ear normal.  Left Ear: External ear normal.  Nose: Nose normal.  Mouth/Throat: Oropharynx is clear and moist. No oropharyngeal exudate.  Eyes: Conjunctivae and EOM are normal. Pupils are equal, round, and reactive to light. Right eye exhibits no discharge. Left eye exhibits no discharge. No scleral icterus.  Neck: Normal range of motion. Neck supple. No JVD present. No tracheal deviation present.  Cardiovascular: Normal rate and regular rhythm.   Pulmonary/Chest: Effort normal and breath sounds normal. No stridor. No respiratory distress.  Musculoskeletal: Normal range of motion. She exhibits no edema.       Left hand: She exhibits tenderness. She exhibits normal range of motion and normal capillary refill. Normal sensation noted.  Left index finger: superficial abrasion to medial nail fold on the lateral side. TTP over the DIP joint. Pink nail beds. Nl cap refill. Nl ROM and nl sensation.  Lymphadenopathy:    She has no cervical adenopathy.  Neurological: She is  alert and oriented to person, place, and time. She exhibits normal muscle tone. Coordination normal.  Skin: Skin is warm and dry. No rash noted. She is not diaphoretic. No erythema. No pallor.  Psychiatric: She has a normal mood and affect. Her behavior is normal. Judgment and thought content normal.  Nursing note and vitals reviewed.   ED Course  Procedures (including critical care time) DIAGNOSTIC STUDIES: Oxygen Saturation is 96% on RA, nl by my interpretation.    COORDINATION OF CARE: 3:34 PM  Discussed treatment plan with pt at bedside which includes left index finger xray, RICE, pain medication Rx and pt agreed to plan.    Labs Review Labs Reviewed - No data to display  Imaging Review Dg Finger Index Left  12/29/2014  CLINICAL DATA:  Patient slammed her left index finger in a car door today. Complaining of pain with a nail bed region laceration. EXAM: LEFT INDEX FINGER 2+V COMPARISON:  None. FINDINGS: No fracture. Joints are normally spaced and aligned. No radiopaque foreign body. Soft tissues are unremarkable. IMPRESSION: Negative. Electronically Signed   By: Olivia Kim M.D.   On: 12/29/2014 14:29   I have personally reviewed and evaluated these images as part of my medical decision-making.   EKG Interpretation None      MDM   Final diagnoses:  Contusion of index finger without damage to nail, left, initial encounter    Pt with finger contusion with small abrasion to proximal lateral nail fold. Mild swelling. Xray negative for fx.  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  She will follow-up with her PCP as needed.  Rice therapy and wound care reviewed. Patient was discharged in good and stable condition.  I personally performed the services described in this documentation, which was scribed in my presence. The recorded information has been reviewed and is accurate.      Olivia Berry, Kim 12/30/14 2202  Olivia Fossa, MD 01/03/15 [phone]

## 2014-12-29 NOTE — ED Notes (Signed)
Pt slammed her lt pointer finger in the car door last night.  C/o pain.

## 2016-03-18 IMAGING — CT CT HEAD WITHOUT CONTRAST
1 series · 16 of 30 positions shown, 20 images · non-contrast
Comparison: CT 03/10/2014

CLINICAL DATA: Altered mental status, delusional thoughts. Over the
counter cough medicine. Subsequent encounter.

EXAM:
CT HEAD WITHOUT CONTRAST
TECHNIQUE: Contiguous axial images were obtained from the base of the skull
through the vertex without intravenous contrast.

[Series 2: head wo · axial · 0.41mm/px · z∈[+1092,+1220]mm · 16 of 30 slices shown, 20 images]
[im 2/30  brain]
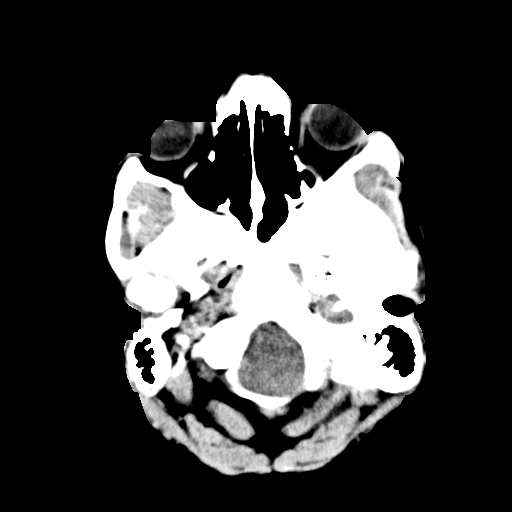
[im 2/30  bone]
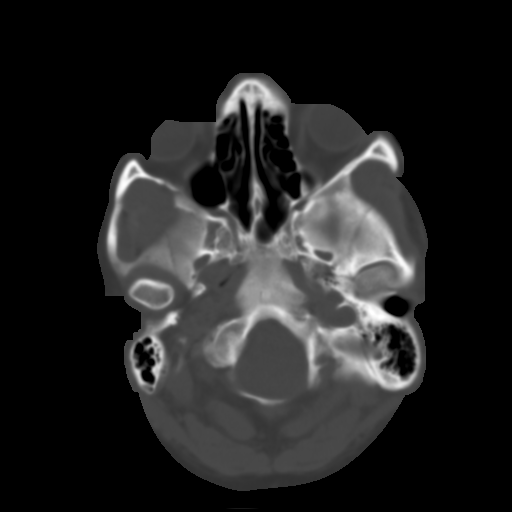
[im 4/30  brain]
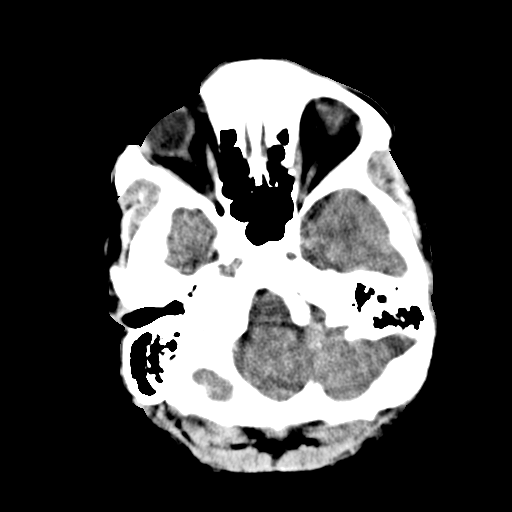
[im 6/30  brain]
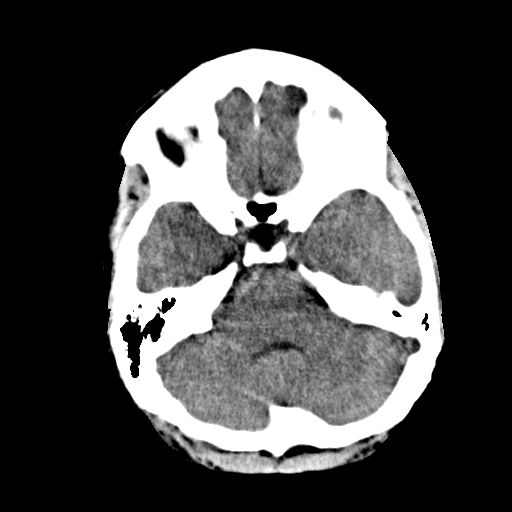
[im 8/30  brain]
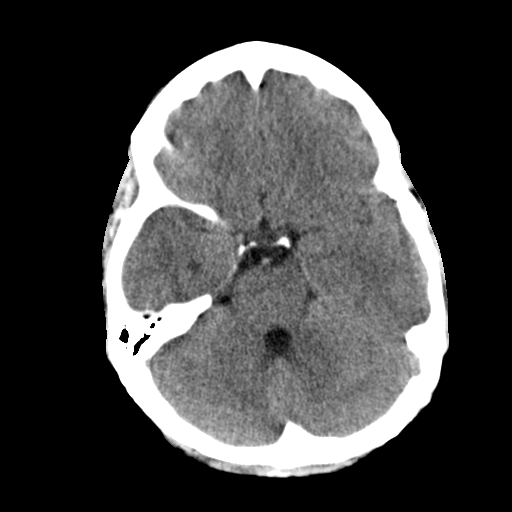
[im 9/30  brain]
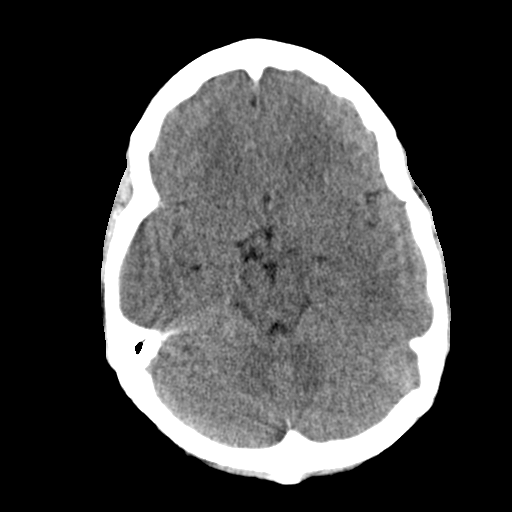
[im 9/30  bone]
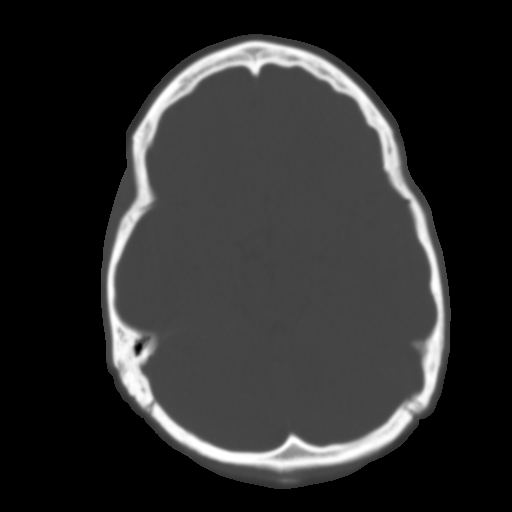
[im 11/30  brain]
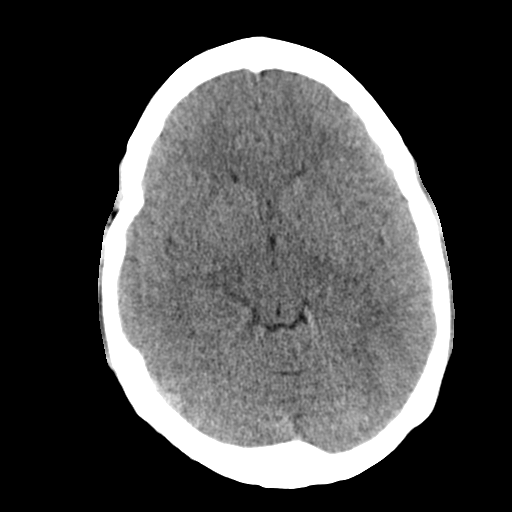
[im 13/30  brain]
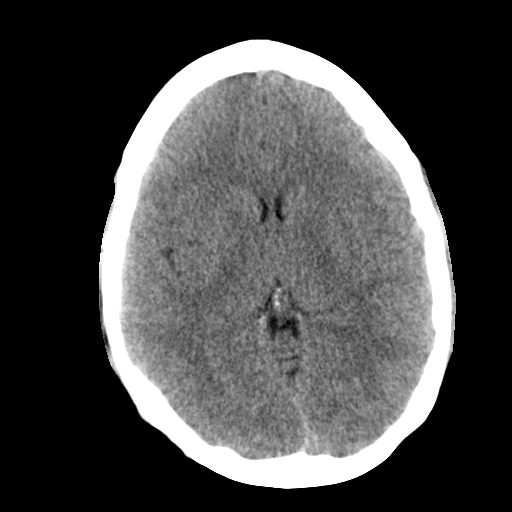
[im 15/30  brain]
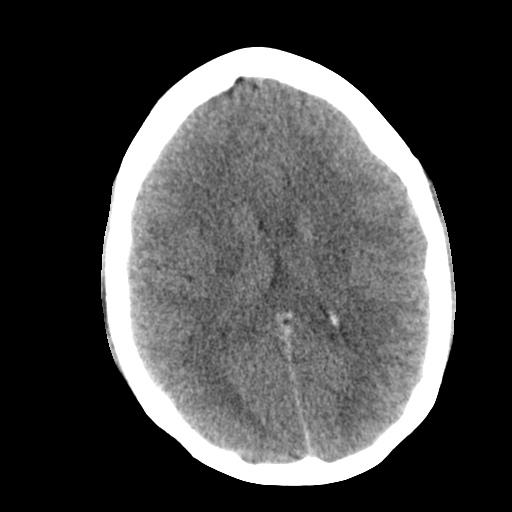
[im 16/30  brain]
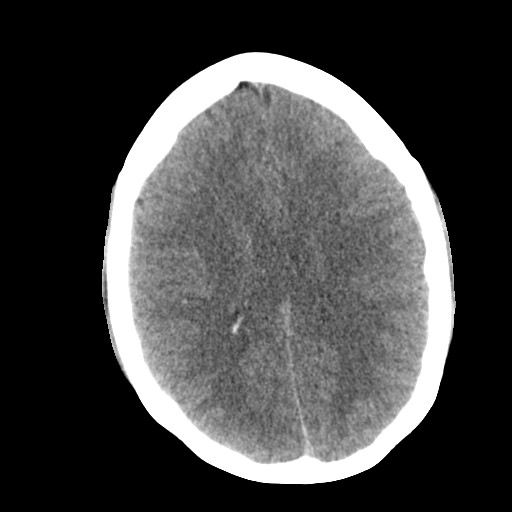
[im 16/30  bone]
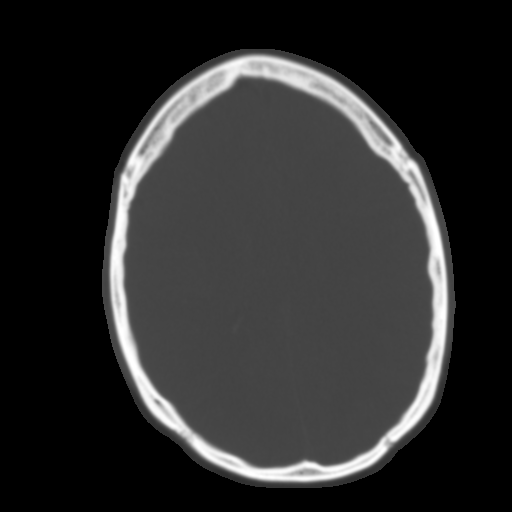
[im 18/30  brain]
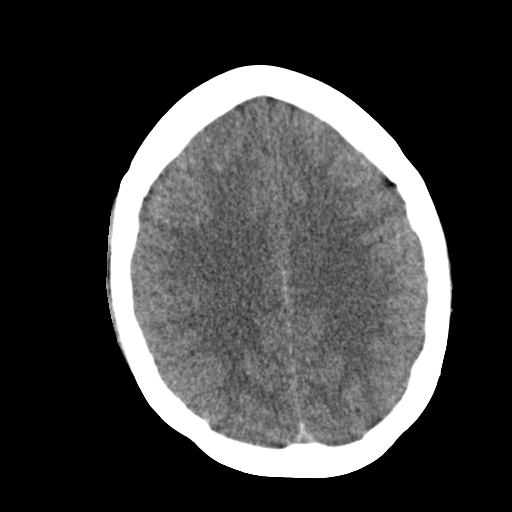
[im 20/30  brain]
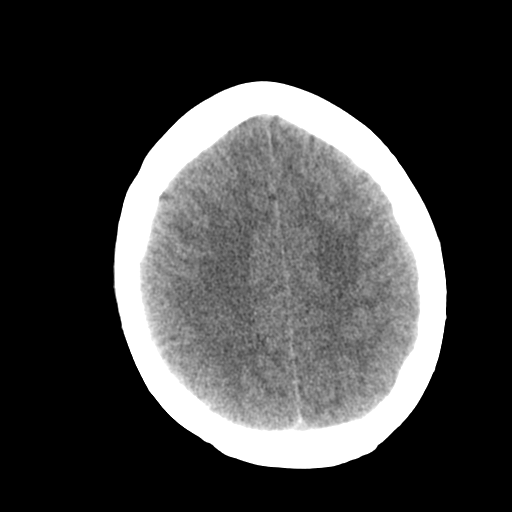
[im 22/30  brain]
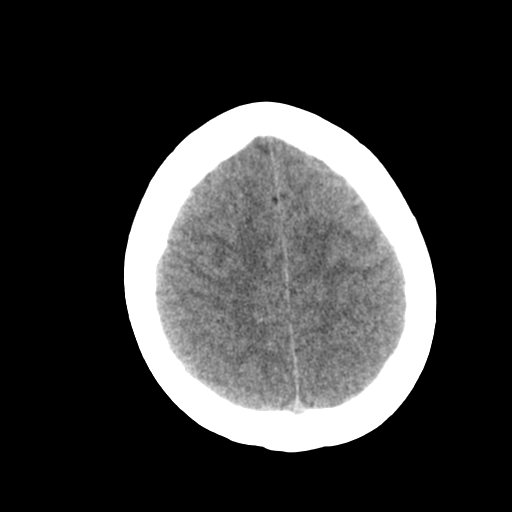
[im 23/30  brain]
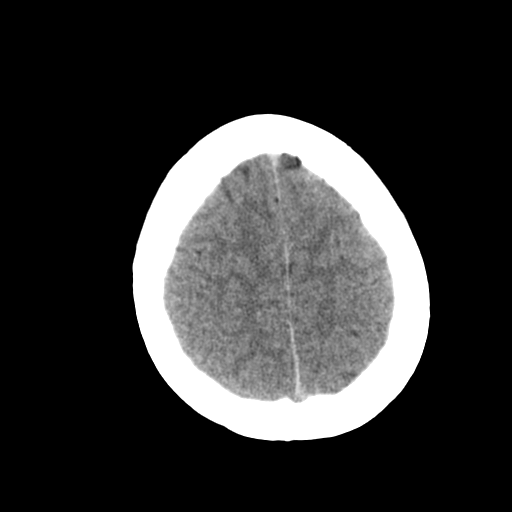
[im 23/30  bone]
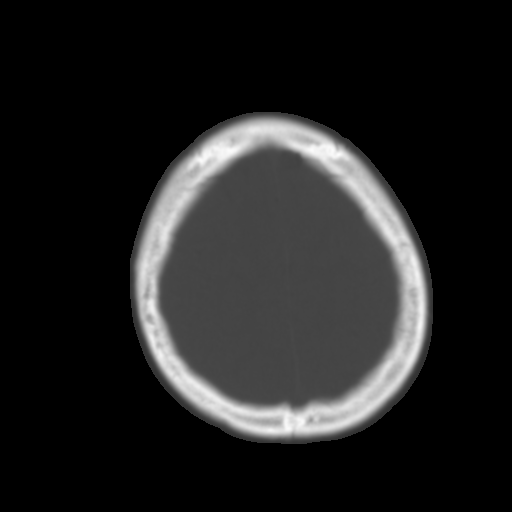
[im 25/30  brain]
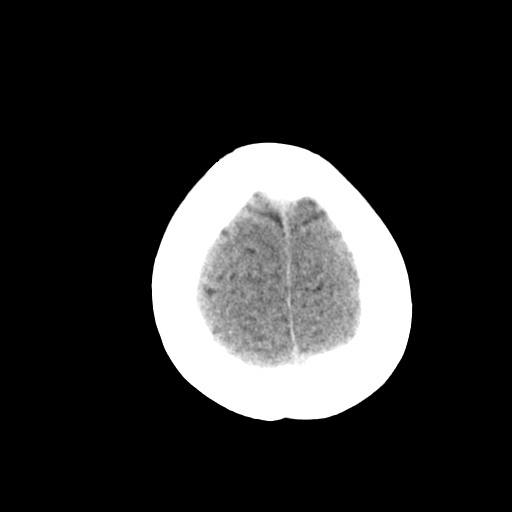
[im 27/30  brain]
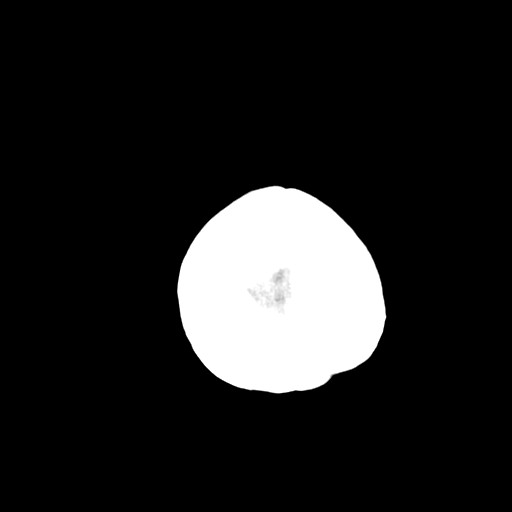
[im 29/30  brain]
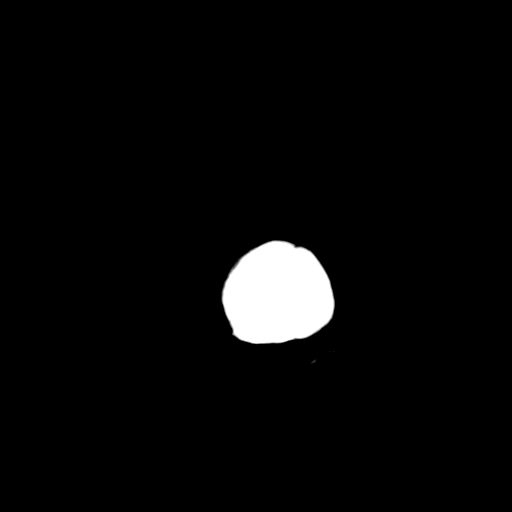

[16 of 30 positions shown; findings below may reference images not displayed]

FINDINGS: No acute intracranial hemorrhage. No focal mass lesion. No CT
evidence of acute infarction. No midline shift or mass effect. No
hydrocephalus. Basilar cisterns are patent. Paranasal sinuses and
mastoid air cells are clear.
IMPRESSION: Normal head CT.  No change from 3 days prior.

## 2018-09-07 ENCOUNTER — Emergency Department
Admission: EM | Admit: 2018-09-07 | Discharge: 2018-09-07 | Disposition: A | Discharge status: Home / Self Care | Payer: BC Managed Care – PPO | Attending: Emergency Medicine | Admitting: Emergency Medicine

## 2018-09-07 ENCOUNTER — Other Ambulatory Visit: Payer: Self-pay

## 2018-09-07 ENCOUNTER — Encounter: Payer: Self-pay | Admitting: Emergency Medicine

## 2018-09-07 DIAGNOSIS — H519 Unspecified disorder of binocular movement: Secondary | ICD-10-CM

## 2018-09-07 DIAGNOSIS — H5789 Other specified disorders of eye and adnexa: Secondary | ICD-10-CM | POA: Diagnosis present

## 2018-09-07 DIAGNOSIS — H5589 Other irregular eye movements: Secondary | ICD-10-CM | POA: Diagnosis not present

## 2018-09-07 DIAGNOSIS — N3090 Cystitis, unspecified without hematuria: Secondary | ICD-10-CM | POA: Insufficient documentation

## 2018-09-07 HISTORY — DX: Compression of brain: G93.5

## 2018-09-07 LAB — CBC WITH DIFFERENTIAL/PLATELET
Abs Immature Granulocytes: 0.02 10*3/uL (ref 0.00–0.07)
Basophils Absolute: 0.1 10*3/uL (ref 0.0–0.1)
Basophils Relative: 1 %
Eosinophils Absolute: 0.2 10*3/uL (ref 0.0–0.5)
Eosinophils Relative: 2 %
HCT: 42 % (ref 36.0–46.0)
Hemoglobin: 13.4 g/dL (ref 12.0–15.0)
Immature Granulocytes: 0 %
Lymphocytes Relative: 28 %
Lymphs Abs: 2.8 10*3/uL (ref 0.7–4.0)
MCH: 30 pg (ref 26.0–34.0)
MCHC: 31.9 g/dL (ref 30.0–36.0)
MCV: 94.2 fL (ref 80.0–100.0)
Monocytes Absolute: 0.9 10*3/uL (ref 0.1–1.0)
Monocytes Relative: 8 %
Neutro Abs: 6.2 10*3/uL (ref 1.7–7.7)
Neutrophils Relative %: 61 %
Platelets: 332 10*3/uL (ref 150–400)
RBC: 4.46 MIL/uL (ref 3.87–5.11)
RDW: 12.7 % (ref 11.5–15.5)
WBC: 10.1 10*3/uL (ref 4.0–10.5)
nRBC: 0 % (ref 0.0–0.2)

## 2018-09-07 LAB — URINALYSIS, COMPLETE (UACMP) WITH MICROSCOPIC
Bilirubin Urine: NEGATIVE
Glucose, UA: NEGATIVE mg/dL
Hgb urine dipstick: NEGATIVE
Ketones, ur: NEGATIVE mg/dL
Leukocytes,Ua: NEGATIVE
Nitrite: POSITIVE — AB
Protein, ur: 30 mg/dL — AB
Specific Gravity, Urine: 1.025 (ref 1.005–1.030)
pH: 6 (ref 5.0–8.0)

## 2018-09-07 LAB — SEDIMENTATION RATE: Sed Rate: 11 mm/hr (ref 0–20)

## 2018-09-07 LAB — POCT PREGNANCY, URINE: Preg Test, Ur: NEGATIVE

## 2018-09-07 LAB — BASIC METABOLIC PANEL
Anion gap: 7 (ref 5–15)
BUN: 13 mg/dL (ref 6–20)
CO2: 29 mmol/L (ref 22–32)
Calcium: 9.3 mg/dL (ref 8.9–10.3)
Chloride: 106 mmol/L (ref 98–111)
Creatinine, Ser: 0.81 mg/dL (ref 0.44–1.00)
GFR calc Af Amer: >60 mL/min (ref >60)
GFR calc non Af Amer: >60 mL/min (ref >60)
Glucose, Bld: 100 mg/dL — ABNORMAL HIGH (ref 70–99)
Potassium: 4.4 mmol/L (ref 3.5–5.1)
Sodium: 142 mmol/L (ref 135–145)

## 2018-09-07 MED ORDER — CEPHALEXIN 500 MG PO CAPS: 500.0000 mg | ORAL_CAPSULE | Freq: Two times a day (BID) | ORAL | 0 refills | Status: AC

## 2018-09-07 NOTE — ED Triage Notes (Signed)
Says she had episodes today where her eyes were drawn up and she could not look down.  Not sure how long it lasts,b ut it gets better when she closes her eyes.  Also better with rest.  Had this happen in June--went to urgent care and they said maybe seizure.

## 2018-09-07 NOTE — Discharge Instructions (Signed)
Your urine test shows signs of a urinary tract infection today.  Take Keflex as prescribed to treat this infection.  Your other blood tests were all normal.  Your symptoms may be due to dystonia.  If it happens again, try taking 50 mg of Benadryl to see if that stops the symptoms.  Please follow-up with neurology when able.

## 2018-09-07 NOTE — ED Provider Notes (Signed)
[facility] Emergency Department Provider Note  ____________________________________________  Time seen: Approximately 5:47 PM  I have reviewed the triage vital signs and the nursing notes.   HISTORY  Chief Complaint Eye Problem    HPI [name] is a 28 y.o. 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  She reports that both eyes were looking upward and that she was unable to control her gaze.  No loss of consciousness or change in her vision during this time, just could not point her eyes in the direction that she wanted.  She reports this happened on 1 day in June.  [facility] and referred to outpatient neurology.  She reports that she has not had any recent change in her medications or her dosages and has been compliant with all her medications.  No recent head trauma.  No fevers chills or other illness.  No sick contacts.  Each episode of eye movement last about 20 minutes according the patient, resolved spontaneously, no aggravating or alleviating factors or notable triggers.  She has not noticed any particular pattern to it.  No radiating pain or other involuntary movements.      Past Medical History:  Diagnosis Date  . Anxiety   . Chiari I malformation (HCC)   . Depression      Patient Active Problem List   Diagnosis Date Noted  . 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 R/O Bipolar I disorder, most recent episode (or current) manic 03/10/2014  . Psychoses (HCC)   . Substance use disorder 03/08/2014     History reviewed. No pertinent surgical history.   Prior to Admission medications   Medication Sig Start Date End Date Taking? Authorizing Provider   HYDROcodone-acetaminophen (NORCO/VICODIN) 5-325 MG tablet Take 2 tablets by mouth every 4 (four) hours as needed. 12/29/14   Danelle Berryapia, Leisa, PA-C     Allergies Patient has no known allergies.   Family History  Problem Relation Age of Onset  . Bipolar disorder Mother   . ADD / ADHD Mother     Social History Social History   Tobacco Use  . Smoking status: Never Smoker  . Smokeless tobacco: Never Used  Substance Use Topics  . Alcohol use: Yes    Comment: cough syrup; pt reports drinking wine once a month  . Drug use: Yes    Types: Other-see comments    Comment: cough syrup    Review of Systems  Constitutional:   No fever or chills.  ENT:   No sore throat. No rhinorrhea. Cardiovascular:   No chest pain or syncope. Respiratory:   No dyspnea or cough. Gastrointestinal:   Negative for abdominal pain, vomiting and diarrhea.  Musculoskeletal:   Negative for focal pain or swelling All other systems reviewed and are negative except as documented above in ROS and HPI.  ____________________________________________   PHYSICAL EXAM:  VITAL SIGNS: ED Triage Vitals  Enc Vitals Group     BP 09/07/18 1627 103/61     Pulse Rate 09/07/18 1417 79     Resp 09/07/18 1417 14     Temp 09/07/18 1417 99 F (37.2 C)     Temp src --      SpO2 09/07/18 1417 100 %     Weight 09/07/18 1418  155 lb (70.3 kg)     Height 09/07/18 1418 5\' 5"  (1.651 m)     Head Circumference --      Peak Flow --      Pain Score 09/07/18 1418 0     Pain Loc --      Pain Edu? --      Excl. in GC? --     Vital signs reviewed, nursing assessments reviewed.   Constitutional:   Alert and oriented. Non-toxic appearance. Eyes:   Conjunctivae are normal. EOMI. PERRLA.  No APD ENT      Head:   Normocephalic and atraumatic.      Nose:   No congestion/rhinnorhea.       Mouth/Throat:   MMM, no pharyngeal erythema. No peritonsillar mass.       Neck:   No meningismus. Full  ROM. Hematological/Lymphatic/Immunilogical:   No cervical lymphadenopathy. Cardiovascular:   RRR. Symmetric bilateral radial and DP pulses.  No murmurs. Cap refill less than 2 seconds. Respiratory:   Normal respiratory effort without tachypnea/retractions. Breath sounds are clear and equal bilaterally. No wheezes/rales/rhonchi. Gastrointestinal:   Soft and nontender. Non distended. There is no CVA tenderness.  No rebound, rigidity, or guarding. Genitourinary:   deferred Musculoskeletal:   Normal range of motion in all extremities. No joint effusions.  No lower extremity tenderness.  No edema. Neurologic:   Normal speech and language.  Motor grossly intact. Normal finger-to-nose and no pronator drift No acute focal neurologic deficits are appreciated.  Skin:    Skin is warm, dry and intact. No rash noted.  No petechiae, purpura, or bullae.  ____________________________________________    LABS (pertinent positives/negatives) (all labs ordered are listed, but only abnormal results are displayed) Labs Reviewed  BASIC METABOLIC PANEL - Abnormal; Notable for the following components:      Result Value   Glucose, Bld 100 (*)    All other components within normal limits  URINALYSIS, COMPLETE (UACMP) WITH MICROSCOPIC - Abnormal; Notable for the following components:   Color, Urine YELLOW (*)    APPearance CLOUDY (*)    Protein, ur 30 (*)    Nitrite POSITIVE (*)    Bacteria, UA MANY (*)    All other components within normal limits  URINE CULTURE  CBC WITH DIFFERENTIAL/PLATELET  SEDIMENTATION RATE  POC URINE PREG, ED  POCT PREGNANCY, URINE   ____________________________________________   EKG    ____________________________________________    RADIOLOGY  No results found.  ____________________________________________   PROCEDURES Procedures  ____________________________________________    CLINICAL IMPRESSION / ASSESSMENT AND PLAN / ED COURSE  Medications ordered in  the ED: Medications - No data to display  Pertinent labs & imaging results that were available during my care of the patient were reviewed by me and considered in my medical decision making (see chart for details).  Olivia Kim was evaluated in Emergency Department on 09/07/2018 for the symptoms described in the history of present illness. She was evaluated in the context of the global COVID-19 pandemic, which necessitated consideration that the patient might be at risk for infection with the SARS-CoV-2 virus that causes COVID-19. Institutional protocols and algorithms that pertain to the evaluation of patients at risk for COVID-19 are in a state of rapid change based on information released by regulatory bodies including the CDC and federal and state organizations. These policies and algorithms were followed during the patient's care in the ED.   Patient presents with a report of abnormal eye movements.  She is  currently neurologically intact, asymptomatic, at baseline.  Well-appearing and nontoxic.  The symptoms sound like a dystonia.  She had neuroimaging in 2016, and I do not think it is warranted to repeat this imaging today.Considering the patient's symptoms, medical history, and physical examination today, I have low suspicion for ischemic stroke, intracranial hemorrhage, meningitis, encephalitis, carotid or vertebral dissection, venous sinus thrombosis, MS, intracranial hypertension, glaucoma, CRAO, CRVO, or temporal arteritis.  Recommended she try Benadryl 50 mg if it happens again.  Labs today are normal.  Urinalysis does show signs of nitrate positive urinary tract infection for which I will send a urine culture and prescribe a course of Keflex.  Referral information for neurology given.      ____________________________________________   FINAL CLINICAL IMPRESSION(S) / ED DIAGNOSES    Final diagnoses:  Cystitis  Abnormal eye movements     ED Discharge Orders     None      Portions of this note were generated with dragon dictation software. Dictation errors may occur despite best attempts at proofreading.   Carrie Mew, MD 09/07/18 [phone]

## 2018-09-10 LAB — URINE CULTURE: Culture: >=100000 — AB
